# Patient Record
Sex: Female | Born: 1975 | Marital: Married | Smoking: Never smoker
Health system: Southern US, Community
[De-identification: ages and names within clinical notes are randomized; demographics above are authoritative.]

## PROBLEM LIST (undated history)

## (undated) DIAGNOSIS — I4581 Long QT syndrome: Secondary | ICD-10-CM

---

## 2019-11-01 ENCOUNTER — Ambulatory Visit (HOSPITAL_COMMUNITY): Payer: Self-pay | Admitting: Licensed Clinical Social Worker

## 2019-11-15 ENCOUNTER — Ambulatory Visit (INDEPENDENT_AMBULATORY_CARE_PROVIDER_SITE_OTHER): Payer: No Typology Code available for payment source | Admitting: Licensed Clinical Social Worker

## 2019-11-15 DIAGNOSIS — F900 Attention-deficit hyperactivity disorder, predominantly inattentive type: Secondary | ICD-10-CM

## 2019-11-15 DIAGNOSIS — F411 Generalized anxiety disorder: Secondary | ICD-10-CM | POA: Diagnosis not present
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
## 2020-01-12 ENCOUNTER — Other Ambulatory Visit: Payer: Self-pay

## 2020-01-12 ENCOUNTER — Ambulatory Visit (INDEPENDENT_AMBULATORY_CARE_PROVIDER_SITE_OTHER): Payer: No Typology Code available for payment source | Admitting: Clinical

## 2020-01-12 ENCOUNTER — Telehealth (HOSPITAL_COMMUNITY): Payer: Self-pay | Admitting: Clinical

## 2020-01-12 DIAGNOSIS — F900 Attention-deficit hyperactivity disorder, predominantly inattentive type: Secondary | ICD-10-CM | POA: Diagnosis not present

## 2020-01-12 DIAGNOSIS — F411 Generalized anxiety disorder: Secondary | ICD-10-CM

## 2020-01-12 NOTE — Progress Notes (Signed)
Virtual Visit via Video Note  I connected with Rebecca Cherry on 01/12/20 at  9:00 AM EDT by a video enabled telemedicine application and verified that I am speaking with the correct person using two identifiers.  Location: Patient:Home Provider:Office  I discussed the limitations, risks, security and privacy concerns of performing an evaluation and management service by telephone and the availability of in person appointments. I also discussed with the patient that there may be a patient responsible charge related to this service. The patient expressed understanding and agreed to proceed.       THERAPIST PROGRESS NOTE  Session Time:9:00AM-9:45AM  Participation Level:Active  Behavioral Response:CasualAlertIrratable and NA  Type of Therapy:Individual Therapy  Treatment Goals addressed:Coping  Interventions:CBT, Motivational Interviewing, Strength-based and Supportive  Summary:  B. Williamsis a 44 y.o.femalewho presents with ADHD and GAD.The OPT therapist worked with thepatientfor herinitial  OPT treatment session. The OPT therapist utilized Motivational Interviewing to assist in creating therapeutic repore. The patient in the session was engaged and work in collaboration giving feedback about her triggers and symptoms over the past few weeksincludingdifficulty managing her symptoms and noting she feels her current medication is "not working". The OPT therapist worked with the patient implementing elements of CBT. The OPT therapist provided psycho-education during the session.The OPT therapist inquired for holistic care about the patients medication therapy and the patient will be looking to transition her medication therapy from her Primary Care Provider through the [facility] to [facility] health to a psychiatrist.  Suicidal/Homicidal:Nowithout intent/plan  Therapist Response:The OPT therapist worked with the patient for the patientsscheduled  session. The patient was engaged in her session and gave feedback in relation to triggers, symptoms, and behavior responses over the pastfewweeks. The patienthas been having difficulty managing symptoms of her ADHD and Anxiety.The OPT therapist in session worked with the patient and utilized elements of CBT in working with the patient to help empower the patient who at times was emotional. The patient will be looking to transition her medication therapy from her Primary Care Provider through the [place] to [facility] health to a psychiatrist.  Plan: Return again in2/3weeks.  Diagnosis:Axis I:ADHD, inattentive type and GAD  Axis II:No diagnosis  I discussed the assessment and treatment plan with the patient. The patient was provided an opportunity to ask questions and all were answered. The patient agreed with the plan and demonstrated an understanding of the instructions.  The patient was advised to call back or seek an in-person evaluation if the symptoms worsen or if the condition fails to improve as anticipated.  I provided37minutes of non-face-to-face time during this encounter.  Winfred Burn, LCSW 01/12/2020

## 2020-01-12 NOTE — Telephone Encounter (Signed)
Talked with patient in detail about prior auth through [place], must have in order to be seen and if referral date range is not within dates of service patient will be responsible for balance. Patient stated she has not signed anything and is not responsible for for bill. I called [facility] the VA rep Lynne Logan [phone] Auth [id number] only good for Therapy April 21- October 21. Pt asking for Med management [facility] rep advised PATIENT MUST call and get prior Auth that is the patients responsibility    , once approved will be good for 1 year only once year is over patient must request new referral or patient is responsible for balance.

## 2020-02-19 ENCOUNTER — Ambulatory Visit (INDEPENDENT_AMBULATORY_CARE_PROVIDER_SITE_OTHER): Payer: No Typology Code available for payment source | Admitting: Clinical

## 2020-02-19 ENCOUNTER — Other Ambulatory Visit: Payer: Self-pay

## 2020-02-19 DIAGNOSIS — F411 Generalized anxiety disorder: Secondary | ICD-10-CM

## 2020-02-19 DIAGNOSIS — F900 Attention-deficit hyperactivity disorder, predominantly inattentive type: Secondary | ICD-10-CM | POA: Diagnosis not present

## 2020-02-19 NOTE — Progress Notes (Signed)
  Virtual Visit via Video Note  I connected withMeghan Louna Cherry on 02/19/20 at  9:00 AM EDT by a video enabled telemedicine application and verified that I am speaking with the correct person using two identifiers.  Location: Patient:Home Provider:Office  I discussed the limitations, risks, security and privacy concerns of performing an evaluation and management service by telephone and the availability of in person appointments. I also discussed with the patient that there may be a patient responsible charge related to this service. The patient expressed understanding and agreed to proceed.       THERAPIST PROGRESS NOTE  Session Time:9:00AM-9:55AM  Participation Level:Active  Behavioral Response:CasualAlertIrratableand NA  Type of Therapy:Individual Therapy  Treatment Goals addressed:Coping  Interventions:CBT, Motivational Interviewing, Strength-based and Supportive  Summary:Rebecca B. Williamsis a44y.o.femalewho presents with ADHD and GAD.The OPT therapist worked with thepatientfor herOPT treatment session. The OPT therapist utilized Motivational Interviewing to assist in creating therapeutic repore. The patient in the session was engaged and work in Tour manager about hertriggers and symptoms over the past few weeksincludingdifficulty managingher time and work/life balance which has spilled over to conflict in her relationship and with others.. The OPT therapist worked with the patient implementing elements of CBT. The OPT therapist provided psycho-education during the session.The OPT therapist worked with the patient on implementing time management with flexability, positive thinking, and communication.  Suicidal/Homicidal:Nowithout intent/plan  Therapist Response:The OPT therapist worked with the patient for the patientsscheduled session. The patient was engaged in hersession and gave feedback in relation to  triggers, symptoms, and behavior responses over the pastfewweeks. The patienthas been having difficulty managing symptoms of her ADHD and Anxiety.The OPT therapist in session worked with the patient and utilized elements of CBT in working with the patient to help empower the patient who has been triggered by work/life balance difficulty. The OPT therapist worked with the patient on time management with flexibility, positive thinking, and communication with others.  Plan: Return again in2/3weeks.  Diagnosis:Axis I:ADHD, inattentive type and GAD  Axis II:No diagnosis  I discussed the assessment and treatment plan with the patient. The patient was provided an opportunity to ask questions and all were answered. The patient agreed with the plan and demonstrated an understanding of the instructions.  The patient was advised to call back or seek an in-person evaluation if the symptoms worsen or if the condition fails to improve as anticipated.  I provided30minutes of non-face-to-face time during this encounter.  Winfred Burn, LCSW 02/19/2020

## 2020-03-11 ENCOUNTER — Ambulatory Visit (INDEPENDENT_AMBULATORY_CARE_PROVIDER_SITE_OTHER): Payer: No Typology Code available for payment source | Admitting: Clinical

## 2020-03-11 ENCOUNTER — Other Ambulatory Visit: Payer: Self-pay

## 2020-03-11 DIAGNOSIS — F411 Generalized anxiety disorder: Secondary | ICD-10-CM

## 2020-03-11 DIAGNOSIS — F900 Attention-deficit hyperactivity disorder, predominantly inattentive type: Secondary | ICD-10-CM | POA: Diagnosis not present

## 2020-03-11 NOTE — Progress Notes (Signed)
  Virtual Visit via Video Note  I connected withMeghan Deshannon Cherry 09/13/21at 9:00 AM EDTby a video enabled telemedicine application and verified that I am speaking with the correct person using two identifiers.  Location: Patient:Home Provider:Office  I discussed the limitations, risks, security and privacy concerns of performing an evaluation and management service by telephone and the availability of in person appointments. I also discussed with the patient that there may be a patient responsible charge related to this service. The patient expressed understanding and agreed to proceed.       THERAPIST PROGRESS NOTE  Session Time:9:00AM-9:55AM  Participation Level:Active  Behavioral Response:CasualAlertIrratableand NA  Type of Therapy:Individual Therapy  Treatment Goals addressed:Coping  Interventions:CBT, Motivational Interviewing, Strength-based and Supportive  Summary:Rebecca B. Williamsis a44y.o.femalewho presents with ADHDand GAD.The OPT therapist worked with thepatientfor herOPT treatment session. The OPT therapist utilized Motivational Interviewing to assist in creating therapeutic repore. 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 The patient is currently in the process of transitioning her medication management from the [place] to [facility].  Suicidal/Homicidal:Nowithout intent/plan  Therapist Response:The OPT therapist worked with the patient for the patientsscheduled session. The patient was engaged in  hersession and gave feedback in relation to triggers, symptoms, and behavior responses over the pastfewweeks. The patienthas been having difficultymanaging symptoms of her ADHD and Anxiety.The OPT therapist in sessionworkedwith the patient and utilized elements of CBT in working with the patient to help empower the patient who has been triggered by work/life balance difficulty. 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 The OPT therapist worked with the patient on time management with flexibility, positive thinking, and communication with others.  Plan: Return again in2/3weeks.  Diagnosis:Axis I:ADHD,inattentivetype and GAD  Axis II:No diagnosis  I discussed the assessment and treatment plan with the patient. The patient was provided an opportunity to ask questions and all were answered. The patient agreed with the plan and demonstrated an understanding of the instructions.  The patient was advised to call back or seek an in-person evaluation if the symptoms worsen or if the condition fails to improve as anticipated.  I provided5minutes of non-face-to-face time during this encounter.  Winfred Burn, LCSW 03/11/2020

## 2020-04-01 ENCOUNTER — Ambulatory Visit (HOSPITAL_COMMUNITY): Payer: Self-pay | Admitting: Clinical

## 2020-04-02 ENCOUNTER — Telehealth (HOSPITAL_COMMUNITY): Payer: Self-pay | Admitting: Psychiatry

## 2020-04-02 NOTE — Telephone Encounter (Signed)
Returned call to schedule f/u appt, lvm

## 2020-04-08 ENCOUNTER — Ambulatory Visit (HOSPITAL_COMMUNITY): Payer: Self-pay | Admitting: Clinical

## 2020-10-20 ENCOUNTER — Emergency Department (HOSPITAL_COMMUNITY): Admitting: Anesthesiology

## 2020-10-20 ENCOUNTER — Emergency Department (HOSPITAL_COMMUNITY)

## 2020-10-20 ENCOUNTER — Encounter (HOSPITAL_COMMUNITY)
Admission: EM | Disposition: A | Discharge status: Home / Self Care | Payer: Self-pay | Source: Home / Self Care | Attending: Emergency Medicine

## 2020-10-20 ENCOUNTER — Ambulatory Visit (HOSPITAL_COMMUNITY)
Admission: EM | Admit: 2020-10-20 | Discharge: 2020-10-21 | Disposition: A | Discharge status: Home / Self Care | Attending: Orthopedic Surgery | Admitting: Orthopedic Surgery

## 2020-10-20 ENCOUNTER — Encounter (HOSPITAL_COMMUNITY): Payer: Self-pay | Admitting: Emergency Medicine

## 2020-10-20 ENCOUNTER — Other Ambulatory Visit: Payer: Self-pay

## 2020-10-20 DIAGNOSIS — S81812A Laceration without foreign body, left lower leg, initial encounter: Secondary | ICD-10-CM | POA: Insufficient documentation

## 2020-10-20 DIAGNOSIS — S51011A Laceration without foreign body of right elbow, initial encounter: Secondary | ICD-10-CM | POA: Diagnosis not present

## 2020-10-20 DIAGNOSIS — S51811A Laceration without foreign body of right forearm, initial encounter: Secondary | ICD-10-CM | POA: Insufficient documentation

## 2020-10-20 DIAGNOSIS — S71112A Laceration without foreign body, left thigh, initial encounter: Secondary | ICD-10-CM | POA: Diagnosis not present

## 2020-10-20 DIAGNOSIS — S6412XA Injury of median nerve at wrist and hand level of left arm, initial encounter: Secondary | ICD-10-CM | POA: Insufficient documentation

## 2020-10-20 DIAGNOSIS — S61422A Laceration with foreign body of left hand, initial encounter: Secondary | ICD-10-CM | POA: Diagnosis not present

## 2020-10-20 DIAGNOSIS — S61412A Laceration without foreign body of left hand, initial encounter: Secondary | ICD-10-CM | POA: Diagnosis not present

## 2020-10-20 DIAGNOSIS — S41112A Laceration without foreign body of left upper arm, initial encounter: Secondary | ICD-10-CM | POA: Diagnosis not present

## 2020-10-20 DIAGNOSIS — S61411A Laceration without foreign body of right hand, initial encounter: Secondary | ICD-10-CM | POA: Diagnosis not present

## 2020-10-20 DIAGNOSIS — S51812A Laceration without foreign body of left forearm, initial encounter: Secondary | ICD-10-CM | POA: Diagnosis not present

## 2020-10-20 DIAGNOSIS — S0101XA Laceration without foreign body of scalp, initial encounter: Secondary | ICD-10-CM | POA: Diagnosis not present

## 2020-10-20 DIAGNOSIS — S0990XA Unspecified injury of head, initial encounter: Secondary | ICD-10-CM | POA: Diagnosis present

## 2020-10-20 DIAGNOSIS — S41111A Laceration without foreign body of right upper arm, initial encounter: Secondary | ICD-10-CM | POA: Insufficient documentation

## 2020-10-20 DIAGNOSIS — S61210A Laceration without foreign body of right index finger without damage to nail, initial encounter: Secondary | ICD-10-CM | POA: Diagnosis not present

## 2020-10-20 DIAGNOSIS — S66921A Laceration of unspecified muscle, fascia and tendon at wrist and hand level, right hand, initial encounter: Secondary | ICD-10-CM

## 2020-10-20 DIAGNOSIS — S81811A Laceration without foreign body, right lower leg, initial encounter: Secondary | ICD-10-CM | POA: Insufficient documentation

## 2020-10-20 DIAGNOSIS — W540XXA Bitten by dog, initial encounter: Secondary | ICD-10-CM | POA: Insufficient documentation

## 2020-10-20 DIAGNOSIS — Z20822 Contact with and (suspected) exposure to covid-19: Secondary | ICD-10-CM | POA: Insufficient documentation

## 2020-10-20 HISTORY — PX: I & D EXTREMITY: SHX5045

## 2020-10-20 HISTORY — DX: Long QT syndrome: I45.81

## 2020-10-20 LAB — TYPE AND SCREEN
ABO/RH(D): O NEG
Antibody Screen: NEGATIVE

## 2020-10-20 LAB — CBC WITH DIFFERENTIAL/PLATELET
Abs Immature Granulocytes: 0.07 10*3/uL (ref 0.00–0.07)
Basophils Absolute: 0 10*3/uL (ref 0.0–0.1)
Basophils Relative: 0 %
Eosinophils Absolute: 0 10*3/uL (ref 0.0–0.5)
Eosinophils Relative: 0 %
HCT: 33.1 % — ABNORMAL LOW (ref 36.0–46.0)
Hemoglobin: 10.7 g/dL — ABNORMAL LOW (ref 12.0–15.0)
Immature Granulocytes: 0 %
Lymphocytes Relative: 7 %
Lymphs Abs: 1.2 10*3/uL (ref 0.7–4.0)
MCH: 30.6 pg (ref 26.0–34.0)
MCHC: 32.3 g/dL (ref 30.0–36.0)
MCV: 94.6 fL (ref 80.0–100.0)
Monocytes Absolute: 0.7 10*3/uL (ref 0.1–1.0)
Monocytes Relative: 4 %
Neutro Abs: 14.3 10*3/uL — ABNORMAL HIGH (ref 1.7–7.7)
Neutrophils Relative %: 89 %
Platelets: 194 10*3/uL (ref 150–400)
RBC: 3.5 MIL/uL — ABNORMAL LOW (ref 3.87–5.11)
RDW: 13.3 % (ref 11.5–15.5)
WBC: 16.2 10*3/uL — ABNORMAL HIGH (ref 4.0–10.5)
nRBC: 0 % (ref 0.0–0.2)

## 2020-10-20 LAB — BASIC METABOLIC PANEL
Anion gap: 7 (ref 5–15)
BUN: 12 mg/dL (ref 6–20)
CO2: 22 mmol/L (ref 22–32)
Calcium: 8.5 mg/dL — ABNORMAL LOW (ref 8.9–10.3)
Chloride: 110 mmol/L (ref 98–111)
Creatinine, Ser: 0.86 mg/dL (ref 0.44–1.00)
GFR, Estimated: >60 mL/min (ref >60)
Glucose, Bld: 138 mg/dL — ABNORMAL HIGH (ref 70–99)
Potassium: 3.9 mmol/L (ref 3.5–5.1)
Sodium: 139 mmol/L (ref 135–145)

## 2020-10-20 LAB — CBC
HCT: 29.9 % — ABNORMAL LOW (ref 36.0–46.0)
Hemoglobin: 9.8 g/dL — ABNORMAL LOW (ref 12.0–15.0)
MCH: 30.6 pg (ref 26.0–34.0)
MCHC: 32.8 g/dL (ref 30.0–36.0)
MCV: 93.4 fL (ref 80.0–100.0)
Platelets: 169 10*3/uL (ref 150–400)
RBC: 3.2 MIL/uL — ABNORMAL LOW (ref 3.87–5.11)
RDW: 13.2 % (ref 11.5–15.5)
WBC: 14 10*3/uL — ABNORMAL HIGH (ref 4.0–10.5)
nRBC: 0 % (ref 0.0–0.2)

## 2020-10-20 LAB — RESP PANEL BY RT-PCR (FLU A&B, COVID) ARPGX2
Influenza A by PCR: NEGATIVE
Influenza B by PCR: NEGATIVE
SARS Coronavirus 2 by RT PCR: NEGATIVE

## 2020-10-20 LAB — HCG, QUANTITATIVE, PREGNANCY: hCG, Beta Chain, Quant, S: 1 m[IU]/mL (ref <5)

## 2020-10-20 SURGERY — IRRIGATION AND DEBRIDEMENT EXTREMITY
Anesthesia: General | Site: Hand | Laterality: Bilateral

## 2020-10-20 MED ORDER — SODIUM CHLORIDE 0.9 % IV SOLN
3.0000 g | Freq: Once | INTRAVENOUS | Status: AC
  Administered 2020-10-20: 3 g via INTRAVENOUS
  Filled 2020-10-20: qty 3

## 2020-10-20 MED ORDER — LIDOCAINE-EPINEPHRINE (PF) 2 %-1:200000 IJ SOLN
20.0000 mL | Freq: Once | INTRAMUSCULAR | Status: AC
  Administered 2020-10-20: 20 mL
  Filled 2020-10-20: qty 20

## 2020-10-20 MED ORDER — ONDANSETRON HCL 4 MG/2ML IJ SOLN
INTRAMUSCULAR | Status: AC
  Filled 2020-10-20: qty 2

## 2020-10-20 MED ORDER — MORPHINE SULFATE (PF) 4 MG/ML IV SOLN
4.0000 mg | Freq: Once | INTRAVENOUS | Status: AC
  Administered 2020-10-20: 4 mg via INTRAVENOUS
  Filled 2020-10-20: qty 1

## 2020-10-20 MED ORDER — ONDANSETRON HCL 4 MG/2ML IJ SOLN
INTRAMUSCULAR | Status: DC | PRN
  Administered 2020-10-20: 4 mg via INTRAVENOUS

## 2020-10-20 MED ORDER — BUPIVACAINE HCL 0.5 % IJ SOLN
INTRAMUSCULAR | Status: AC
  Filled 2020-10-20: qty 1

## 2020-10-20 MED ORDER — SODIUM CHLORIDE 0.9 % IR SOLN
Status: DC | PRN
  Administered 2020-10-20 – 2020-10-21 (×3): 3000 mL

## 2020-10-20 MED ORDER — LORAZEPAM 2 MG/ML IJ SOLN
1.0000 mg | Freq: Once | INTRAMUSCULAR | Status: AC
  Administered 2020-10-20: 1 mg via INTRAVENOUS
  Filled 2020-10-20: qty 1

## 2020-10-20 MED ORDER — CEFAZOLIN SODIUM 1 G IJ SOLR
INTRAMUSCULAR | Status: AC
  Filled 2020-10-20: qty 20

## 2020-10-20 MED ORDER — PROPOFOL 10 MG/ML IV BOLUS
INTRAVENOUS | Status: AC
  Filled 2020-10-20: qty 20

## 2020-10-20 MED ORDER — FENTANYL CITRATE (PF) 100 MCG/2ML IJ SOLN
INTRAMUSCULAR | Status: DC | PRN
  Administered 2020-10-20: 50 ug via INTRAVENOUS

## 2020-10-20 MED ORDER — SUCCINYLCHOLINE CHLORIDE 20 MG/ML IJ SOLN
INTRAMUSCULAR | Status: DC | PRN
  Administered 2020-10-20: 100 mg via INTRAVENOUS

## 2020-10-20 MED ORDER — BUPIVACAINE HCL 0.5 % IJ SOLN
INTRAMUSCULAR | Status: DC | PRN
  Administered 2020-10-20: 7 mL
  Administered 2020-10-21: 8 mL
  Administered 2020-10-21: 5 mL

## 2020-10-20 MED ORDER — BUPIVACAINE HCL (PF) 0.5 % IJ SOLN
30.0000 mL | Freq: Once | INTRAMUSCULAR | Status: AC
  Administered 2020-10-20: 30 mL
  Filled 2020-10-20: qty 30

## 2020-10-20 MED ORDER — MIDAZOLAM HCL 2 MG/2ML IJ SOLN
INTRAMUSCULAR | Status: AC
  Filled 2020-10-20: qty 2

## 2020-10-20 MED ORDER — KETOROLAC TROMETHAMINE 15 MG/ML IJ SOLN
15.0000 mg | Freq: Once | INTRAMUSCULAR | Status: AC
  Administered 2020-10-20: 15 mg via INTRAVENOUS
  Filled 2020-10-20: qty 1

## 2020-10-20 MED ORDER — ROCURONIUM BROMIDE 100 MG/10ML IV SOLN
INTRAVENOUS | Status: DC | PRN
  Administered 2020-10-20: 60 mg via INTRAVENOUS

## 2020-10-20 MED ORDER — MIDAZOLAM HCL 5 MG/5ML IJ SOLN
INTRAMUSCULAR | Status: DC | PRN
  Administered 2020-10-20: 2 mg via INTRAVENOUS

## 2020-10-20 MED ORDER — PROPOFOL 10 MG/ML IV BOLUS
INTRAVENOUS | Status: DC | PRN
  Administered 2020-10-20: 150 mg via INTRAVENOUS

## 2020-10-20 MED ORDER — EPHEDRINE SULFATE 50 MG/ML IJ SOLN
INTRAMUSCULAR | Status: DC | PRN
  Administered 2020-10-20: 15 mg via INTRAVENOUS

## 2020-10-20 MED ORDER — ACETAMINOPHEN 10 MG/ML IV SOLN
INTRAVENOUS | Status: AC
  Filled 2020-10-20: qty 100

## 2020-10-20 MED ORDER — LACTATED RINGERS IV BOLUS
1000.0000 mL | Freq: Once | INTRAVENOUS | Status: AC
  Administered 2020-10-20: 1000 mL via INTRAVENOUS

## 2020-10-20 MED ORDER — VASOPRESSIN 20 UNIT/ML IV SOLN
INTRAVENOUS | Status: AC
  Filled 2020-10-20: qty 1

## 2020-10-20 MED ORDER — PHENYLEPHRINE HCL (PRESSORS) 10 MG/ML IV SOLN
INTRAVENOUS | Status: DC | PRN
  Administered 2020-10-20: 120 ug via INTRAVENOUS
  Administered 2020-10-20 (×2): 80 ug via INTRAVENOUS
  Administered 2020-10-20: 120 ug via INTRAVENOUS

## 2020-10-20 MED ORDER — FENTANYL CITRATE (PF) 100 MCG/2ML IJ SOLN
100.0000 ug | Freq: Once | INTRAMUSCULAR | Status: AC
  Administered 2020-10-20: 100 ug via INTRAVENOUS
  Filled 2020-10-20: qty 2

## 2020-10-20 MED ORDER — LACTATED RINGERS IV SOLN: INTRAVENOUS | Status: DC | PRN

## 2020-10-20 MED ORDER — DIPHENHYDRAMINE HCL 50 MG/ML IJ SOLN
INTRAMUSCULAR | Status: AC
  Filled 2020-10-20: qty 1

## 2020-10-20 MED ORDER — FENTANYL CITRATE (PF) 250 MCG/5ML IJ SOLN
INTRAMUSCULAR | Status: AC
  Filled 2020-10-20: qty 5

## 2020-10-20 MED ORDER — PHENYLEPHRINE HCL-NACL 10-0.9 MG/250ML-% IV SOLN
INTRAVENOUS | Status: DC | PRN
  Administered 2020-10-20: 50 ug/min via INTRAVENOUS

## 2020-10-20 MED ORDER — LIDOCAINE 2% (20 MG/ML) 5 ML SYRINGE
INTRAMUSCULAR | Status: AC
  Filled 2020-10-20: qty 5

## 2020-10-20 MED ORDER — DEXAMETHASONE SODIUM PHOSPHATE 10 MG/ML IJ SOLN
INTRAMUSCULAR | Status: AC
  Filled 2020-10-20: qty 1

## 2020-10-20 MED ORDER — LIDOCAINE HCL (CARDIAC) PF 100 MG/5ML IV SOSY
PREFILLED_SYRINGE | INTRAVENOUS | Status: DC | PRN
  Administered 2020-10-20: 80 mg via INTRAVENOUS

## 2020-10-20 MED ORDER — CEFAZOLIN SODIUM-DEXTROSE 2-3 GM-%(50ML) IV SOLR
INTRAVENOUS | Status: DC | PRN
  Administered 2020-10-20: 2 g via INTRAVENOUS

## 2020-10-20 MED ORDER — VASOPRESSIN 20 UNIT/ML IV SOLN
INTRAVENOUS | Status: DC | PRN
  Administered 2020-10-20 (×3): 2 [IU] via INTRAVENOUS

## 2020-10-20 MED ORDER — DEXAMETHASONE SODIUM PHOSPHATE 10 MG/ML IJ SOLN
INTRAMUSCULAR | Status: DC | PRN
  Administered 2020-10-20: 10 mg via INTRAVENOUS

## 2020-10-20 SURGICAL SUPPLY — 59 items
BANDAGE ESMARK 6X9 LF (GAUZE/BANDAGES/DRESSINGS) ×1 IMPLANT
BNDG COHESIVE 4X5 TAN STRL (GAUZE/BANDAGES/DRESSINGS) ×3 IMPLANT
BNDG COHESIVE 6X5 TAN STRL LF (GAUZE/BANDAGES/DRESSINGS) ×6 IMPLANT
BNDG ELASTIC 3X5.8 VLCR STR LF (GAUZE/BANDAGES/DRESSINGS) ×6 IMPLANT
BNDG ELASTIC 4X5.8 VLCR STR LF (GAUZE/BANDAGES/DRESSINGS) ×3 IMPLANT
BNDG ELASTIC 6X5.8 VLCR STR LF (GAUZE/BANDAGES/DRESSINGS) ×3 IMPLANT
BNDG ESMARK 6X9 LF (GAUZE/BANDAGES/DRESSINGS) ×3
BNDG GAUZE ELAST 4 BULKY (GAUZE/BANDAGES/DRESSINGS) ×6 IMPLANT
BOOTCOVER CLEANROOM LRG (PROTECTIVE WEAR) ×3 IMPLANT
CORD BIPOLAR FORCEPS 12FT (ELECTRODE) ×6 IMPLANT
COVER SURGICAL LIGHT HANDLE (MISCELLANEOUS) ×6 IMPLANT
COVER WAND RF STERILE (DRAPES) IMPLANT
CUFF TOURN SGL QUICK 34 (TOURNIQUET CUFF)
CUFF TRNQT CYL 34X4.125X (TOURNIQUET CUFF) IMPLANT
DECANTER SPIKE VIAL GLASS SM (MISCELLANEOUS) ×3 IMPLANT
DRAPE ORTHO SPLIT 77X108 STRL (DRAPES) ×4
DRAPE SURG ORHT 6 SPLT 77X108 (DRAPES) ×2 IMPLANT
DRESSING MEPILEX FLEX 4X4 (GAUZE/BANDAGES/DRESSINGS) ×4 IMPLANT
DRSG MEPILEX FLEX 4X4 (GAUZE/BANDAGES/DRESSINGS) ×12
DURAPREP 26ML APPLICATOR (WOUND CARE) IMPLANT
ELECT CAUTERY BLADE 6.4 (BLADE) ×3 IMPLANT
ELECT REM PT RETURN 9FT ADLT (ELECTROSURGICAL) ×3
ELECTRODE REM PT RTRN 9FT ADLT (ELECTROSURGICAL) ×1 IMPLANT
EVACUATOR 1/8 PVC DRAIN (DRAIN) IMPLANT
GAUZE 4X4 16PLY ~~LOC~~+RFID DBL (SPONGE) ×9 IMPLANT
GAUZE SPONGE 4X4 12PLY STRL (GAUZE/BANDAGES/DRESSINGS) ×3 IMPLANT
GAUZE SPONGE 4X4 12PLY STRL LF (GAUZE/BANDAGES/DRESSINGS) ×3 IMPLANT
GAUZE XEROFORM 1X8 LF (GAUZE/BANDAGES/DRESSINGS) IMPLANT
GAUZE XEROFORM 5X9 LF (GAUZE/BANDAGES/DRESSINGS) ×6 IMPLANT
GLOVE BIO SURGEON STRL SZ7 (GLOVE) ×3 IMPLANT
GLOVE ORTHO TXT STRL SZ7.5 (GLOVE) ×9 IMPLANT
GLOVE SURG PR MICRO ENCORE 7 (GLOVE) ×12 IMPLANT
GLOVE SURG UNDER POLY LF SZ7 (GLOVE) ×3 IMPLANT
GOWN STRL REUS W/ TWL LRG LVL3 (GOWN DISPOSABLE) ×5 IMPLANT
GOWN STRL REUS W/TWL LRG LVL3 (GOWN DISPOSABLE) ×10
HANDPIECE INTERPULSE COAX TIP (DISPOSABLE)
KIT BASIN OR (CUSTOM PROCEDURE TRAY) ×3 IMPLANT
KIT TURNOVER KIT B (KITS) ×3 IMPLANT
MANIFOLD NEPTUNE II (INSTRUMENTS) ×3 IMPLANT
NS IRRIG 1000ML POUR BTL (IV SOLUTION) ×3 IMPLANT
PACK ORTHO EXTREMITY (CUSTOM PROCEDURE TRAY) ×3 IMPLANT
PAD ARMBOARD 7.5X6 YLW CONV (MISCELLANEOUS) ×6 IMPLANT
PENCIL BUTTON HOLSTER BLD 10FT (ELECTRODE) ×3 IMPLANT
SET HNDPC FAN SPRY TIP SCT (DISPOSABLE) IMPLANT
SET IRRIG Y TYPE TUR BLADDER L (SET/KITS/TRAYS/PACK) ×6 IMPLANT
SOL PREP PROV IODINE SCRUB 4OZ (MISCELLANEOUS) ×9 IMPLANT
SPONGE LAP 18X18 RF (DISPOSABLE) ×3 IMPLANT
STOCKINETTE IMPERVIOUS 9X36 MD (GAUZE/BANDAGES/DRESSINGS) ×3 IMPLANT
STOCKINETTE IMPERVIOUS LG (DRAPES) ×3 IMPLANT
SUT ETHILON 3 0 PS 1 (SUTURE) ×3 IMPLANT
SUT ETHILON 4 0 PS 2 18 (SUTURE) ×15 IMPLANT
SWAB CULTURE ESWAB REG 1ML (MISCELLANEOUS) IMPLANT
TOWEL GREEN STERILE (TOWEL DISPOSABLE) ×3 IMPLANT
TOWEL GREEN STERILE FF (TOWEL DISPOSABLE) ×3 IMPLANT
TUBE CONNECTING 12'X1/4 (SUCTIONS) ×4
TUBE CONNECTING 12X1/4 (SUCTIONS) ×8 IMPLANT
UNDERPAD 30X36 HEAVY ABSORB (UNDERPADS AND DIAPERS) ×3 IMPLANT
WATER STERILE IRR 1000ML POUR (IV SOLUTION) ×3 IMPLANT
YANKAUER SUCT BULB TIP NO VENT (SUCTIONS) ×6 IMPLANT

## 2020-10-20 NOTE — ED Triage Notes (Signed)
Pt BIB [facility], pt attacked by her dog, injuries to the back of her head, back of her shoulders, bilateral arms and bilateral hands. GCS 15, EMS BP 142/78, HR 73, SpO2 100% room air. Pt given total 32mg  ketamine, last dose at 1519, and fentanyl, 1L NS.

## 2020-10-20 NOTE — ED Notes (Signed)
Patient to OR, care handoff given to CRNA

## 2020-10-20 NOTE — Anesthesia Preprocedure Evaluation (Signed)
Anesthesia Evaluation  Patient identified by MRN, date of birth, ID band Patient awake  General Assessment Comment:S/p dog bite  Reviewed: Allergy & Precautions, NPO status , Patient's Chart, lab work & pertinent test results  Airway Mallampati: II  TM Distance: >3 FB Neck ROM: Full    Dental  (+) Teeth Intact, Dental Advisory Given   Pulmonary neg pulmonary ROS,    Pulmonary exam normal breath sounds clear to auscultation       Cardiovascular Normal cardiovascular exam Rhythm:Regular Rate:Normal  Prolonged QT by EMS report   Neuro/Psych negative neurological ROS  negative psych ROS   GI/Hepatic negative GI ROS, Neg liver ROS,   Endo/Other  negative endocrine ROS  Renal/GU negative Renal ROS     Musculoskeletal negative musculoskeletal ROS (+)   Abdominal   Peds  (+) ADHD Hematology  (+) Blood dyscrasia, anemia ,   Anesthesia Other Findings Day of surgery medications reviewed with the patient.  Reproductive/Obstetrics negative OB ROS                             Anesthesia Physical Anesthesia Plan  ASA: II and emergent  Anesthesia Plan: General   Post-op Pain Management:    Induction: Intravenous, Rapid sequence and Cricoid pressure planned  PONV Risk Score and Plan: 3 and Midazolam, Diphenhydramine, Propofol infusion and Dexamethasone  Airway Management Planned: Oral ETT  Additional Equipment:   Intra-op Plan:   Post-operative Plan: Extubation in OR  Informed Consent: I have reviewed the patients History and Physical, chart, labs and discussed the procedure including the risks, benefits and alternatives for the proposed anesthesia with the patient or authorized representative who has indicated his/her understanding and acceptance.     Dental advisory given  Plan Discussed with: CRNA  Anesthesia Plan Comments:         Anesthesia Quick Evaluation

## 2020-10-20 NOTE — Anesthesia Procedure Notes (Signed)
Procedure Name: Intubation Date/Time: 10/20/2020 10:27 PM Performed by: Mariyana Fulop T, CRNA Pre-anesthesia Checklist: Patient identified, Emergency Drugs available, Suction available and Patient being monitored Patient Re-evaluated:Patient Re-evaluated prior to induction Oxygen Delivery Method: Circle system utilized Preoxygenation: Pre-oxygenation with 100% oxygen Induction Type: IV induction Ventilation: Mask ventilation without difficulty Laryngoscope Size: Miller and 2 Grade View: Grade I Tube type: Oral Tube size: 7.5 mm Number of attempts: 1 Airway Equipment and Method: Stylet and Oral airway Placement Confirmation: ETT inserted through vocal cords under direct vision,  positive ETCO2 and breath sounds checked- equal and bilateral Secured at: 21 cm Tube secured with: Tape Dental Injury: Teeth and Oropharynx as per pre-operative assessment

## 2020-10-20 NOTE — Op Note (Addendum)
10/20/2020  11:33 PM  PATIENT:  Rebecca Cherry    PRE-OPERATIVE DIAGNOSIS: Dog bites to the bilateral upper extremities and left lower extremity  POST-OPERATIVE DIAGNOSIS:    1.  Dog bite to bilateral upper extremity and left lower extremity 2.  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  Multiple lacerations of the left upper extremity as outlined below 4.  Multiple lacerations to the right upper extremity as outlined below with disruption of right extensor Teck tendon mechanisms to the long finger 5.  Lacerations to the left upper thigh  PROCEDURE:    1.  Left upper extremity excisional debridement of multiple lacerations of the upper arm, forearm, and hand with complex closure of lacerations. 2.  Exploration of left palmar wound, hand, with removal of deep foreign body, multiple blades of grass 3.  excisional debridement, irrigation, and complex closure:  1.  Left posterior upper arm, 1.5 cm x 0.5 cm 2.  Left upper arm 1.0 x 0.5 cm 3.  Left forearm, 9 mm x 2 mm 4.  Left dorsal hand wound, 12 x 3 mm 5.  Left dorsal hand wound 10 mm x 1 mm 6.  Left dorsal wound 11 mm x 1 mm 7.  Left palm wound, 34 mm x 5 mm 8.  Left palm wound 11 x 5 mm 9.  Left palm wound 20 mm x 1 mm 10.  Left palm wound 3 mm x 1 mm 11.  Right upper arm wound 20 x 9 mm 12.  Right upper arm wound 14 x 5 mm 13.  Right upper arm wound 10 x 2 mm 14.  Right posterior elbow wound 25 x 40 mm 15.  Right forearm wound 10 x 5 mm 16.  Right dorsal hand wound 9 x 3 mm 17.  Right dorsal hand wound 11 x 2 mm 18.  Right dorsal hand wound 22 x 8 mm 19.  Right dorsal index finger wound 20 x 2 mm 20. Left proximal thigh wound 7x92mm 21.  Left proximal thigh wound 22 x 10 mm    SURGEON:  Eulas Post, MD  PHYSICIAN ASSISTANT: Janine Ores, PA-C,  present and scrubbed throughout the case, critical for completion in a timely fashion, and for retraction, instrumentation, and closure.  ANESTHESIA:   General  PREOPERATIVE INDICATIONS:  Rebecca Cherry is a  45 y.o. female who was attacked by her dog and had multiple dog bites on both upper extremities and her left lower extremity.  On her preoperative examination she had what was thought to be a tendon that was extruded from the palm, and her neurologic exam did have some paresthesias although she reported having sensation intact in her fingertips.  I suspect this was optimistic thinking, and although she did close her eyes during the exam, the degree of neurologic injury found intraoperatively would reflect that she had substantial damage from the dog bite.  The risks benefits and alternatives were discussed with the patient preoperatively including but not limited to the risks of infection, bleeding, nerve injury, the need for revision surgery, cardiopulmonary complications, the need for revision surgery, among others, and the patient was willing to proceed.  ESTIMATED BLOOD LOSS: Minimal  OPERATIVE IMPLANTS: None  OPERATIVE FINDINGS: The left upper arm puncture wounds appeared to involve primarily just  skin and subcutaneous tissue, although it did get down to the muscular fascia.  The forearm wounds were similar, and the dorsal hand wounds did involve some of the musculature as well, although I did not identify any traumatically injured structures on that location.  The most dramatic finding was a transected median nerve that was basically the caliber of the median nerve as it exits the carpal tunnel distally, with no present branches from what I could see.  Additionally, there was a long segment of transected vein that had thrombosed, as well as a long segment of digital nerve that measured at least 7 to 10 cm coming out of the palmar wound.  I closely evaluated her capillary refill as  well as the coloration of her fingertips, and these appear to be intact, with less than 3-second capillary refill in all of the fingers, although certainly the long finger and the ring finger was a little more sluggish than the others, but nonetheless did appear to have adequate flow.  Debridement type: Excisional Debridement  Side: bilaterally  Body Location: Upper extremities and left upper thigh   Tools used for debridement: scissors  Pre-debridement Wound size (cm):  As above.  Post-debridement Wound size (cm):  As above.  Debridement depth beyond dead/damaged tissue down to healthy viable tissue: yes  Tissue layer involved: skin, subcutaneous tissue, muscle / fascia  Nature of tissue removed: Necrotic and Devitalized Tissue  Irrigation volume: 6 liters on the left upper extremity.  6 liters on the right upper extremity.  1 liter on the left upper thigh.     Irrigation fluid type: Normal Saline   OPERATIVE PROCEDURE: The patient was brought to the operating room and placed in the supine position.  General anesthesia was administered.  We started with her left upper extremity.  A Pree scrub was performed with chlorhexidine, and then a thorough Betadine scrub and paint was performed.  I started at the proximal arm, posteriorly, and irrigated copiously, used a scissors to remove a small amount of necrotic fat, cleaned the borders of the skin, and repaired these with nylon.  I then moved distally, and performed the same type of procedure to the wounds on the forearm, as well as the back of the hand.  I explored the volar aspect of the hand, and found a fairly large, 4 cm piece of graft that was embedded deep within the palm of the hand.  During exploration I found that multiple other smaller contaminated foreign material from the debris, and also encountered the stump of the median nerve.  I used a scissors to excise a small part of the vein that was no longer viable, and I also excised a  area of the proximal digital nerve which was necrotic, and completely hemorrhagic, contused, and destroyed.  This measured about 6 mm.  I irrigated copiously with normal saline throughout the remainder of the wounds on the palmar and dorsal aspects of the hand, and then repaired all of her wounds loosely with nylon suture.  She was injected for pain control, and sterile gauze applied.  She will need fairly substantial reconstructive nerve surgery on her median nerve once we are sure that she is clear of infection, particular given the gross contamination found at the time of tonight's surgery.  Entered my attention to the right upper extremity.  Under a separate sterile prep and drape the right upper extremity was prescrubbed and then scrubbed with Betadine scrub and paint.  All of the wounds  were explored, and any necrotic or damaged tissue was excised with a skin scissors and a pickup.  Most of the wounds simply went to the skin and subcutaneous tissue although the right dorsal hand wound to the long finger overlying the metacarpal phalangeal joint did disrupt the extensor mechanism.  All of these wounds were copiously irrigated, and then repaired with nylon suture.  She will need definitive surgical management of her tendon disruption once her soft tissue management has been optimized, and infection risk has been minimized.  Left lower extremity was then draped and cleaned and any abnormal tissue removed along with grass and gross contamination, and then repaired with nylon suture.  All the wounds were dressed, she was awakened and returned the PACU in stable and satisfactory condition.  She will plan to be discharged on oral antibiotics, and we will help coordinate hand surgery subspecialty referral particular given the right long finger extensor tendon and the left palm median nerve disruption.

## 2020-10-20 NOTE — Consult Note (Addendum)
   ORTHOPAEDIC CONSULTATION  REQUESTING PHYSICIAN: Charlynne Pander, MD  Chief Complaint: Multiple dog bites to bilateral upper extremities  HPI: Rebecca Cherry is a 45 y.o. female with history of long QT syndrome, left carpal tunnel syndrome, recent right carpal tunnel release who complains of multiple dog bites of bilateral upper extremities. She was attacked by her dog this afternoon and sustained multiple lacerations to scalp, bilateral upper extremities, left thigh. Pain was severe on arrival to ER but improved significantly with IV pain medication. Patient noticed that she could not extend right middle finger shortly after the attack. Denies numbness, states hands feel diffusely "tingley".  Past Medical History:  Diagnosis Date  . Prolonged QT interval syndrome    History reviewed. No pertinent surgical history. Social History   Socioeconomic History  . Marital status: Married    Spouse name: Not on file  . Number of children: Not on file  . Years of education: Not on file  . Highest education level: Not on file  Occupational History  . Not on file  Tobacco Use  . Smoking status: Not on file  . Smokeless tobacco: Not on file  Substance and Sexual Activity  . Alcohol use: Not on file  . Drug use: Not on file  . Sexual activity: Not on file  Other Topics Concern  . Not on file  Social History Narrative  . Not on file   Social Determinants of Health   Financial Resource Strain: Not on file  Food Insecurity: Not on file  Transportation Needs: Not on file  Physical Activity: Not on file  Stress: Not on file  Social Connections: Not on file   History reviewed. No pertinent family history. No Known Allergies   Positive ROS: All other systems have been reviewed and were otherwise negative with the exception of those mentioned in the HPI and as above.  Physical Exam: General: Alert, laying in bed, in no acute distress as ED provider sutures her scalp  lacerations. Cardiovascular: No pedal edema Respiratory: No cyanosis, no use of accessory musculature GI: No organomegaly, abdomen is soft and non-tender Skin: Please see photos below. Neurologic: Noted below. Psychiatric: Patient is competent for consent with normal mood and affect Lymphatic: No axillary or cervical lymphadenopathy  MUSCULOSKELETAL: Lacerations, edema, and ecchymosis as characterized in photos below. Able to flex, extend, and abduct all fingers of bilateral hands, except right middle finger. Unable to extend left middle finger, able to flex, pain with passive extension. Patient endorses distal sensation to all fingers. Radial pulses intact bilaterally.                                  Assessment/Plan: Multiple dog bites to bilateral upper extremities with contamination with left middle finger extensor tendon injury -plan for urgent irrigation and debridement of upper extremity lacerations with closures as appropriate and repair of left long extensor tendon, risks benefits and alteratives of this operation were discussed with patient and she agrees to move forward with surgical treatment - will plan to discharge on oral antibiotics with close follow up in our office  Armida Sans, PA-C  10/20/2020 9:39 PM

## 2020-10-20 NOTE — ED Provider Notes (Signed)
[facility] Note   CSN: [id number] Arrival date & time: 10/20/20  1535     History Chief Complaint  Patient presents with  . Animal Bite    Rebecca Cherry is a 45 y.o. female.  HPI      Rebecca Cherry is a 45 y.o. female, with a history of prolonged QT syndrome, presenting to the ED with injuries from a dog that occurred shortly prior to arrival.  Multiple dog bites and lacerations including scalp, both upper extremities, left lower extremity. Tetanus vaccination up-to-date.  Dog is owned by the patient and is up-to-date on rabies vaccination. Denies anticoagulation. Denies syncope, neck/back pain, chest injury, abdominal injury, numbness, or any other complaints.   Past Medical History:  Diagnosis Date  . Prolonged QT interval syndrome     There are no problems to display for this patient.   History reviewed. No pertinent surgical history.   OB History   No obstetric history on file.     History reviewed. No pertinent family history.     Home Medications Prior to Admission medications   Not on File    Allergies    Patient has no known allergies.  Review of Systems   Review of Systems  Respiratory: Negative for shortness of breath.   Cardiovascular: Negative for chest pain.  Gastrointestinal: Negative for abdominal pain, nausea and vomiting.  Musculoskeletal: Positive for arthralgias.  Skin: Positive for wound.  Neurological: Negative for syncope and numbness.  All other systems reviewed and are negative.   Physical Exam Updated Vital Signs BP (!) 101/41 (BP Location: Right Leg)   Pulse 67   Temp 97.6 F (36.4 C) (Oral)   Resp 20   LMP 10/17/2020   SpO2 100%   Physical Exam Vitals and nursing note reviewed.  Constitutional:      General: She is not in acute distress.    Appearance: She is well-developed. She is not diaphoretic.  HENT:     Head: Normocephalic.      Comments: Approximately 7 cm laceration to the left parietal scalp, as shown in the photos.  Hematoma and subcutaneous clot noted.     Nose:     Comments: Some dried blood noted to the nares, however, no tenderness, swelling, or pain to the nose.  No intranasal injuries noted.  No septal hematoma.  This blood may be due to the patient's scalp injury running onto her face.    Mouth/Throat:     Mouth: Mucous membranes are moist.     Pharynx: Oropharynx is clear.  Eyes:     Extraocular Movements: Extraocular movements intact.     Conjunctiva/sclera: Conjunctivae normal.     Pupils: Pupils are equal, round, and reactive to light.  Cardiovascular:     Rate and Rhythm: Normal rate and regular rhythm.     Pulses: Normal pulses.          Radial pulses are 2+ on the right side and 2+ on the left side.       Posterior tibial pulses are 2+ on the right side and 2+ on the left side.     Heart sounds: Normal heart sounds.     Comments: Tactile temperature in the extremities appropriate and equal bilaterally. Pulmonary:     Effort: Pulmonary effort is normal. No respiratory distress.     Breath sounds: Normal breath sounds.  Abdominal:     Palpations: Abdomen is soft.     Tenderness:  There is no abdominal tenderness. There is no guarding.  Musculoskeletal:     Cervical back: Normal range of motion and neck supple. No tenderness.     Right lower leg: No edema.     Left lower leg: No edema.     Comments: Patient has several scattered lacerations to the hands, upper extremities, and left leg.  There is a laceration on the dorsal hand proximal to the middle finger.  She is unable to extend the middle finger.  Suspect tendon injury in this location.  Laceration to the left palm with tendon versus vessel extending from the wound.  Distal circulation seems to be intact.  She seems to have full flexion and extension intact in the fingers of the left hand.  Full range of motion without noted pain or  difficulty in the wrists, elbows, shoulders, hips, knees, and ankles.  She does not have any lacerations or noted injuries to the neck, chest, abdomen, pelvis, or back.  Overall trauma exam performed without any abnormalities noted other than those mentioned.  Skin:    General: Skin is warm and dry.     Capillary Refill: Capillary refill takes less than 2 seconds.  Neurological:     Mental Status: She is alert and oriented to person, place, and time.     Comments: Sensation to light touch grossly intact in the extremities down through the fingers and toes. Strength 5/5 in the major joints of the upper and lower extremities bilaterally.  Deficits in the hands are noted separately. No noted cognitive deficit. Handles oral secretions without noted difficulty.   Psychiatric:        Mood and Affect: Mood and affect normal.        Speech: Speech normal.        Behavior: Behavior normal.                                           ED Results / Procedures / Treatments   Labs (all labs ordered are listed, but only abnormal results are displayed) Labs Reviewed  BASIC METABOLIC PANEL - Abnormal; Notable for the following components:      Result Value   Glucose, Bld 138 (*)    Calcium 8.5 (*)    All other components within normal limits  CBC WITH DIFFERENTIAL/PLATELET - Abnormal; Notable for the following components:   WBC 16.2 (*)    RBC 3.50 (*)    Hemoglobin 10.7 (*)    HCT 33.1 (*)    Neutro Abs 14.3 (*)    All other components within normal limits  CBC - Abnormal; Notable for the following components:   WBC 14.0 (*)    RBC 3.20 (*)    Hemoglobin 9.8 (*)    HCT 29.9 (*)    All other components within normal limits  RESP PANEL BY RT-PCR (FLU A&B, COVID) [id number]  HCG, QUANTITATIVE, PREGNANCY  TYPE AND SCREEN  ABO/RH    EKG EKG Interpretation  Date/Time:  Sunday October 20 2020 16:16:42 EDT Ventricular Rate:  59 PR Interval:    QRS  Duration: 105 QT Interval:  512 QTC Calculation: 508 R Axis:   81 Text Interpretation: Atrial fibrillation Borderline prolonged QT interval No previous ECGs available Confirmed by Richardean Canal ([id number]) on 10/20/2020 4:21:53 PM   Radiology DG Forearm Left  Result Date: 10/20/2020 CLINICAL DATA:  Dog  bite with injuries to head, shoulders, bilateral arms and bilateral hands. Swelling. EXAM: LEFT FOREARM - 2 VIEW COMPARISON:  None. FINDINGS: Cortical margins of the forearm are intact. There is no evidence of fracture or other focal bone lesions. Wrist and elbow alignment are maintained. Scattered soft tissue edema. No radiopaque foreign body. IMPRESSION: Scattered soft tissue edema. No fracture. Electronically Signed   By: Narda Rutherford M.D.   On: 10/20/2020 17:07   DG Forearm Right  Result Date: 10/20/2020 CLINICAL DATA:  Dog bite with injuries to head, shoulders, bilateral arms and bilateral hands. Swelling. EXAM: RIGHT FOREARM - 2 VIEW COMPARISON:  None. FINDINGS: Cortical margins of the radius and ulna are intact. There is no evidence of fracture or other focal bone lesions. Wrist and elbow alignment are maintained. Small soft tissue density adjacent to the distal ulna appears to represent calcification rather than foreign body. Soft tissue edema is noted about the dorsal mid proximal forearm. IMPRESSION: 1. Soft tissue edema without acute fracture. 2. Small density adjacent to the distal ulna appears to represent calcification rather than foreign body. Electronically Signed   By: Narda Rutherford M.D.   On: 10/20/2020 17:22   CT Head Wo Contrast  Result Date: 10/20/2020 CLINICAL DATA:  Dog attack with multiple injuries including head laceration EXAM: CT HEAD WITHOUT CONTRAST TECHNIQUE: Contiguous axial images were obtained from the base of the skull through the vertex without intravenous contrast. COMPARISON:  None. FINDINGS: Brain: No evidence of acute infarction, hemorrhage, hydrocephalus,  extra-axial collection or mass lesion/mass effect. Vascular: No hyperdense vessel. Scattered atherosclerotic calcifications of the internal carotid arteries at skull base. Skull: Normal. Negative for fracture or focal lesion. Sinuses/Orbits: Paranasal sinuses and mastoid air cells are predominantly clear. Orbits are intact. Other: Left lateral extra calvarial hematoma with cutaneous skin defects, subcutaneous emphysema and edema. IMPRESSION: 1. No acute intracranial abnormality. 2. Left lateral extra calvarial hematoma with cutaneous skin defects, subcutaneous emphysema and edema. No underlying calvarial fracture. Electronically Signed   By: Maudry Mayhew MD   On: 10/20/2020 17:10   DG Humerus Left  Result Date: 10/20/2020 CLINICAL DATA:  Dog bite with injuries to head, shoulders, bilateral arms and bilateral hands. Swelling. EXAM: LEFT HUMERUS - 2+ VIEW COMPARISON:  None. FINDINGS: Cortical margins of the humerus are intact. There is no evidence of fracture or other focal bone lesions. Shoulder and elbow alignment are maintained. Scattered soft tissue air involving the distal arm, no radiopaque foreign body. IMPRESSION: Scattered soft tissue air. No fracture. Electronically Signed   By: Narda Rutherford M.D.   On: 10/20/2020 17:07   DG Humerus Right  Result Date: 10/20/2020 CLINICAL DATA:  Dog bite with injuries to head, shoulders, bilateral arms and bilateral hands. Swelling. EXAM: RIGHT HUMERUS - 2+ VIEW COMPARISON:  None. FINDINGS: Cortical margins of the humerus are intact. There is no evidence of fracture or other focal bone lesions. Shoulder and elbow alignment are maintained. Soft tissue edema about the mid lateral aspect of the humerus with soft tissue air. No radiopaque foreign body. IMPRESSION: Soft tissue edema and soft tissue air. No fracture. Electronically Signed   By: Narda Rutherford M.D.   On: 10/20/2020 17:06   DG Hand Complete Left  Result Date: 10/20/2020 CLINICAL DATA:  Dog bite  with injuries to head, shoulders, bilateral arms and bilateral hands. Swelling. EXAM: LEFT HAND - COMPLETE 3+ VIEW COMPARISON:  None. FINDINGS: Difficulty with positioning due to pain. Digits are held in flexion on all views and  not well assessed. Allowing for this, no evidence of fracture. No frank dislocation. Soft tissue edema greatest overlying the thumb metacarpal with soft tissue air. No radiopaque foreign body. IMPRESSION: Soft tissue edema greatest overlying the thumb metacarpal with soft tissue air. No fracture. No radiopaque foreign body. Electronically Signed   By: Narda Rutherford M.D.   On: 10/20/2020 17:05   DG Hand Complete Right  Result Date: 10/20/2020 CLINICAL DATA:  Dog bite with injuries to head, shoulders, bilateral arms and bilateral hands. Swelling. EXAM: RIGHT HAND - COMPLETE 3+ VIEW COMPARISON:  None. FINDINGS: Non traditional positioning due to pain which limits assessment. No evidence of fracture. No gross dislocation allowing for positioning. Soft tissue edema overlies the dorsum of the hand with occasional soft tissue air. No radiopaque foreign body. IMPRESSION: Soft tissue edema and occasional soft tissue air. No radiopaque foreign body or fracture. Electronically Signed   By: Narda Rutherford M.D.   On: 10/20/2020 17:03    Procedures .Marland KitchenLaceration Repair  Date/Time: 10/20/2020 8:30 PM Performed by: Anselm Pancoast, PA-C Authorized by: Anselm Pancoast, PA-C   Consent:    Consent obtained:  Verbal   Consent given by:  Patient   Risks, benefits, and alternatives were discussed: yes     Risks discussed:  Infection, need for additional repair, poor cosmetic result, poor wound healing, pain and retained foreign body Universal protocol:    Procedure explained and questions answered to patient or proxy's satisfaction: yes     Patient identity confirmed:  Verbally with patient and provided demographic data Anesthesia:    Anesthesia method:  Local infiltration   Local anesthetic:   Lidocaine 2% WITH epi and bupivacaine 0.5% w/o epi Laceration details:    Location:  Scalp   Scalp location:  L parietal   Length (cm):  7 Pre-procedure details:    Preparation:  Patient was prepped and draped in usual sterile fashion and imaging obtained to evaluate for foreign bodies Exploration:    Imaging outcome: foreign body not noted     Wound exploration: wound explored through full range of motion   Treatment:    Area cleansed with:  Saline   Amount of cleaning:  Extensive   Irrigation solution:  Sterile saline   Irrigation volume:  1000cc   Irrigation method:  Syringe   Debridement:  Minimal Skin repair:    Repair method:  Sutures   Suture size:  3-0   Wound skin closure material used: Vicryl.   Suture technique:  Horizontal mattress (and one simple interrupted)   Number of sutures:  6 Approximation:    Approximation:  Loose Repair type:    Repair type:  Complex Post-procedure details:    Dressing:  Open (no dressing)   Procedure completion:  Tolerated well, no immediate complications     Medications Ordered in ED Medications  sodium chloride irrigation 0.9 % (3,000 mLs  Given 10/21/20 0003)  bupivacaine (MARCAINE) 0.5 % (with pres) injection (7 mLs Infiltration Given 10/20/20 2337)  lactated ringers bolus 1,000 mL (0 mLs Intravenous Stopped 10/20/20 1801)  fentaNYL (SUBLIMAZE) injection 100 mcg (100 mcg Intravenous Given 10/20/20 1612)  Ampicillin-Sulbactam (UNASYN) 3 g in sodium chloride 0.9 % 100 mL IVPB (0 g Intravenous Stopped 10/20/20 1800)  LORazepam (ATIVAN) injection 1 mg (1 mg Intravenous Given 10/20/20 1716)  ketorolac (TORADOL) 15 MG/ML injection 15 mg (15 mg Intravenous Given 10/20/20 1732)  lactated ringers bolus 1,000 mL (0 mLs Intravenous Stopped 10/20/20 1950)  lidocaine-EPINEPHrine (XYLOCAINE W/EPI) 2 %-  1:200000 (PF) injection 20 mL (20 mLs Infiltration Given 10/20/20 1733)  bupivacaine (MARCAINE) 0.5 % injection 30 mL (30 mLs Infiltration Given 10/20/20  1733)  morphine 4 MG/ML injection 4 mg (4 mg Intravenous Given 10/20/20 1900)  lactated ringers bolus 1,000 mL (0 mLs Intravenous Stopped 10/20/20 2142)    ED Course  I have reviewed the triage vital signs and the nursing notes.  Pertinent labs & imaging results that were available during my care of the patient were reviewed by me and considered in my medical decision making (see chart for details).  Clinical Course as of 10/21/20 4128  Sun Oct 20, 2020  2020 Spoke with Dr. Dion Saucier, on-call for hand surgery. We discussed the patient's injuries, mechanism of injury, deficits noted on exam.  He reviewed the clinical pictures and imaging.  He will plan to take the patient to the OR tonight for washout. [SJ]    Clinical Course User Index [SJ] Chade Pitner, Hillard Danker, PA-C   MDM Rules/Calculators/A&P                          Patient presents for evaluation of injuries from dog bites. Patient has several areas of dog bite.  Concern for some extensive injuries, especially to her hands, specifically concern for tendon injuries.  The status of the swelling in her hands was reevaluated frequently.  Tissues remain soft without evidence of compartment syndrome.  It is important to note that the patient had instances of documented hypotension without concurrent tachycardia.  She states she has known issues with vagal episodes when she is under stress or in pain.  These occurred frequently following her surgeries and after injuries.  She had no changes in mental status or overall presentation.  I do not think these episodes are due to blood loss, as evidenced by the minimal decrease of her hemoglobin during her ED course.  Therefore, I do not think this patient qualifies as a level 1 trauma activation.  Patient has several wounds that will need to heal by secondary intention due to dog bite as their origin.   Findings and plan of care discussed with attending physician, Chaney Malling, MD. Dr. Silverio Lay personally evaluated  and examined this patient.  Vitals:   10/20/20 2045 10/20/20 2053 10/20/20 2100 10/20/20 2115  BP: (!) 113/46  (!) 100/41 (!) 112/50  Pulse: 75  72 80  Resp: 20  14 (!) 28  Temp:      TempSrc:      SpO2: 100%  100% 100%  Weight:  79.4 kg    Height:  5\' 8"  (1.727 m)       Final Clinical Impression(s) / ED Diagnoses Final diagnoses:  Dog bite, initial encounter  Laceration of left hand involving tendon, initial encounter  Scalp laceration, initial encounter    Rx / DC Orders ED Discharge Orders    None       10/21/20 0038    10/23/20, MD 10/22/20 2236

## 2020-10-21 ENCOUNTER — Other Ambulatory Visit (HOSPITAL_COMMUNITY): Payer: Self-pay

## 2020-10-21 ENCOUNTER — Encounter (HOSPITAL_COMMUNITY): Payer: Self-pay | Admitting: Orthopedic Surgery

## 2020-10-21 MED ORDER — ACETAMINOPHEN 10 MG/ML IV SOLN
INTRAVENOUS | Status: DC | PRN
  Administered 2020-10-21: 1000 mg via INTRAVENOUS

## 2020-10-21 MED ORDER — HYDROCODONE-ACETAMINOPHEN 10-325 MG PO TABS: 1.0000 | ORAL_TABLET | Freq: Four times a day (QID) | ORAL | 0 refills | Status: AC | PRN

## 2020-10-21 MED ORDER — HYDROCODONE-ACETAMINOPHEN 10-325 MG PO TABS
1.0000 | ORAL_TABLET | Freq: Four times a day (QID) | ORAL | 0 refills | Status: DC | PRN
  Filled 2020-10-21: qty 28, 7d supply, fill #0

## 2020-10-21 MED ORDER — FENTANYL CITRATE (PF) 100 MCG/2ML IJ SOLN: 25.0000 ug | INTRAMUSCULAR | Status: DC | PRN

## 2020-10-21 MED ORDER — ROCURONIUM BROMIDE 10 MG/ML (PF) SYRINGE
PREFILLED_SYRINGE | INTRAVENOUS | Status: AC
  Filled 2020-10-21: qty 10

## 2020-10-21 MED ORDER — AMOXICILLIN-POT CLAVULANATE 875-125 MG PO TABS
1.0000 | ORAL_TABLET | Freq: Two times a day (BID) | ORAL | 0 refills | Status: DC
  Filled 2020-10-21: qty 20, 10d supply, fill #0

## 2020-10-21 MED ORDER — AMOXICILLIN-POT CLAVULANATE 875-125 MG PO TABS: 1.0000 | ORAL_TABLET | Freq: Two times a day (BID) | ORAL | 0 refills | Status: AC

## 2020-10-21 MED ORDER — ONDANSETRON HCL 4 MG/2ML IJ SOLN
4.0000 mg | Freq: Once | INTRAMUSCULAR | Status: AC | PRN
  Administered 2020-10-21: 4 mg via INTRAVENOUS

## 2020-10-21 MED ORDER — SUGAMMADEX SODIUM 200 MG/2ML IV SOLN
INTRAVENOUS | Status: DC | PRN
  Administered 2020-10-21: 200 mg via INTRAVENOUS

## 2020-10-21 MED ORDER — ONDANSETRON HCL 4 MG/2ML IJ SOLN
INTRAMUSCULAR | Status: AC
  Filled 2020-10-21: qty 2

## 2020-10-21 MED ORDER — 0.9 % SODIUM CHLORIDE (POUR BTL) OPTIME
TOPICAL | Status: DC | PRN
  Administered 2020-10-21: 1000 mL

## 2020-10-21 NOTE — Transfer of Care (Signed)
Immediate Anesthesia Transfer of Care Note  Patient: Rebecca Cherry  Procedure(s) Performed: IRRIGATION AND DEBRIDEMENT BILATERAL UPPER EXTREMITIES, AND LEFT THIGH (Bilateral Hand)  Patient Location: PACU  Anesthesia Type:General  Level of Consciousness: drowsy  Airway & Oxygen Therapy: Patient Spontanous Breathing and Patient connected to nasal cannula oxygen  Post-op Assessment: Report given to RN, Post -op Vital signs reviewed and stable and Patient moving all extremities  Post vital signs: Reviewed and stable  Last Vitals:  Vitals Value Taken Time  BP 87/29 10/21/20 0107  Temp    Pulse 90 10/21/20 0109  Resp 17 10/21/20 0109  SpO2 100 % 10/21/20 0109  Vitals shown include unvalidated device data.  Last Pain:  Vitals:   10/20/20 1900  TempSrc:   PainSc: 3          Complications: No complications documented.

## 2020-10-21 NOTE — Discharge Instructions (Signed)
Animal Bite, Adult Animal bites range from mild to serious. An animal bite can result in any of these injuries:  A scratch.  A deep, open cut.  A puncture of the skin.  A crush injury.  Tearing away of the skin or a body part.  A bone injury. A small bite from a house pet is usually less serious than a bite from a stray or wild animal, such as a raccoon, fox, skunk, or bat. That is because stray and wild animals have a higher risk of carrying a serious infection called rabies, which can be passed to humans through a bite. What increases the risk? You are more likely to be bitten by an animal if:  You are around unfamiliar pets.  You disturb an animal when it is eating, sleeping, or caring for its babies.  You are outdoors in a place where small, wild animals roam freely. What are the signs or symptoms? Common symptoms of an animal bite include:  Pain.  Bleeding.  Swelling.  Bruising. How is this diagnosed? This condition may be diagnosed based on a physical exam and medical history. Your health care provider will examine your wound and ask for details about the animal and how the bite happened. You may also have tests, such as:  Blood tests to check for infection.  X-rays to check for damage to bones or joints.  Taking a fluid sample from your wound and checking it for infection (culture test). How is this treated? Treatment varies depending on the type of animal, where the bite is on your body, and your medical history. Treatment may include:  Caring for the wound. This often includes cleaning the wound, rinsing out (flushing) the wound with saline solution, and applying a bandage (dressing). In some cases, the wound may be closed with stitches (sutures), staples, skin glue, or adhesive strips.  Antibiotic medicine to prevent or treat infection. This medicine may be prescribed in pill or ointment form. If the bite area becomes infected, the medicine may be given  through an IV.  A tetanus shot to prevent tetanus infection.  Rabies treatment to prevent rabies infection. This will be done if the animal could have rabies.  Surgery. This may be done if a bite gets infected or if there is damage that needs to be repaired. Follow these instructions at home: Wound care  Follow instructions from your health care provider about how to take care of your wound. Make sure you: ? Wash your hands with soap and water before you change your dressing. If soap and water are not available, use hand sanitizer. ? Change your dressing as told by your health care provider. ? Leave sutures, skin glue, or adhesive strips in place. These skin closures may need to stay in place for 2 weeks or longer. If adhesive strip edges start to loosen and curl up, you may trim the loose edges. Do not remove adhesive strips completely unless your health care provider tells you to do that.  Check your wound every day for signs of infection. Check for: ? More redness, swelling, or pain. ? More fluid or blood. ? Warmth. ? Pus or a bad smell.   Medicines  Take or apply over-the-counter and prescription medicines only as told by your health care provider.  If you were prescribed an antibiotic, take or apply it as told by your health care provider. Do not stop using the antibiotic even if your condition improves. General instructions  Keep the  injured area raised (elevated) above the level of your heart while you are sitting or lying down, if this is possible.  If directed, put ice on the injured area. ? Put ice in a plastic bag. ? Place a towel between your skin and the bag. ? Leave the ice on for 20 minutes, 2-3 times per day.  Keep all follow-up visits as told by your health care provider. This is important.   Contact a health care provider if:  You have more redness, swelling, or pain around your wound.  Your wound feels warm to the touch.  You have a fever or chills.  You  have a general feeling of sickness (malaise).  You feel nauseous or you vomit.  You have pain that does not get better. Get help right away if:  You have a red streak that leads away from your wound.  You have non-clear fluid or more blood coming from your wound.  There is pus or a bad smell coming from your wound.  You have trouble moving your injured area.  You have numbness or tingling that extends beyond the wound. Summary  Animal bites can range from mild to serious. An animal bite can cause a scratch on the skin, a deep open cut, a puncture of the skin, a crush injury, tearing away of the skin or a body part, or a bone injury.  Your health care provider will examine your wound and ask for details about the animal and how the bite happened.  You may also have tests such as a blood test, X-ray, or testing of a fluid sample from your wound (culture test).  Treatment may include wound care, antibiotic medicine, a tetanus shot, and rabies treatment if the animal could have rabies. This information is not intended to replace advice given to you by your health care provider. Make sure you discuss any questions you have with your health care provider. Document Revised: 04/09/2020 Document Reviewed: 04/09/2020 Elsevier Patient Education  2021 [employer] Inc.   Laceration Care, Adult A laceration is a cut that may go through all layers of the skin and into the tissue that is right under the skin. Some lacerations heal on their own. Others need to be closed with stitches (sutures), staples, skin adhesive strips, or skin glue. Proper care of a laceration reduces the risk for infection, helps the laceration heal better, and may prevent scarring. How to care for your laceration Wash your hands with soap and water before touching your wound or changing your bandage (dressing). If soap and water are not available, use hand sanitizer. Keep the wound clean and dry. If you were given a dressing,  you should change it at least once a day, or as told by your health care provider. You should also change it if it becomes wet or dirty. If sutures or staples were used:  Keep the wound completely dry for the first 24 hours, or as told by your health care provider. After that time, you may shower or bathe. However, make sure that the wound is not soaked in water until after the sutures or staples have been removed.  Clean the wound once each day, or as told by your health care provider: ? Wash the wound with soap and water. ? Rinse the wound with water to remove all soap. ? Pat the wound dry with a clean towel. Do not rub the wound.  After cleaning the wound, apply a thin layer of antibiotic ointment as  told by your health care provider. This will help prevent infection and keep the dressing from sticking to the wound.  Have the sutures or staples removed as told by your health care provider. If skin adhesive strips were used:  Do not get the skin adhesive strips wet. You may shower or bathe, but be careful to keep the wound dry.  If the wound gets wet, pat it dry with a clean towel. Do not rub the wound.  Skin adhesive strips fall off on their own. You may trim the strips as the wound heals. Do not remove skin adhesive strips that are still stuck to the wound. They will fall off in time. If skin glue was used:  Try to keep the wound dry, but you may briefly wet it in the shower or bath. Do not soak the wound in water, such as by swimming.  After you have showered or bathed, gently pat the wound dry with a clean towel. Do not rub the wound.  Do not do any activities that will make you sweat heavily until the skin glue has fallen off on its own.  Do not apply liquid, cream, or ointment medicine to the wound while the skin glue is in place. Using those may loosen the film before the wound has healed.  If a dressing is placed over the wound, be careful not to apply tape directly over the  skin glue. Doing that may cause the glue to be pulled off before the wound has healed.  Do not pick at the glue. Skin glue usually remains in place for 5-10 days and then falls off the skin. General instructions  Take over-the-counter and prescription medicines only as told by your health care provider.  If you were prescribed an antibiotic medicine or ointment, take or apply it as told by your health care provider. Do not stop using it even if your condition improves.  Do not scratch or pick at the wound.  Check your wound every day for signs of infection. Watch for: ? Redness, swelling, or pain. ? Fluid, blood, or pus.  Raise (elevate) the injured area above the level of your heart while you are sitting or lying down for the first 24-48 hours after the laceration is repaired.  If directed, put ice on the affected area: ? Put ice in a plastic bag. ? Place a towel between your skin and the bag. ? Leave the ice on for 20 minutes, 2-3 times a day.  Keep all follow-up visits as told by your health care provider. This is important.   Contact a health care provider if:  You received a tetanus shot and you have swelling, severe pain, redness, or bleeding at the injection site.  You have a fever.  A wound that was closed breaks open.  You notice a bad smell coming from your wound or your dressing.  You notice something coming out of the wound, such as wood or glass.  Your pain is not controlled with medicine.  You have increased redness, swelling, or pain at the site of your wound.  You have fluid, blood, or pus coming from your wound.  You need to change the dressing often due to fluid, blood, or pus that is draining from the wound.  You develop a new rash.  You develop numbness around the wound. Get help right away if:  You develop severe swelling around the wound.  Your pain suddenly increases and is severe.  You develop painful  lumps near the wound or on skin anywhere  else on your body.  You have a red streak going away from your wound.  The wound is on your hand or foot and you cannot properly move a finger or toe.  The wound is on your hand or foot, and you notice that your fingers or toes look pale or bluish. Summary  A laceration is a cut that may go through all layers of the skin and into the tissue that is right under the skin.  Some lacerations heal on their own. Others need to be closed with stitches (sutures), staples, skin adhesive strips, or skin glue.  Proper care of a laceration reduces the risk of infection, helps the laceration heal better, and prevents scarring. This information is not intended to replace advice given to you by your health care provider. Make sure you discuss any questions you have with your health care provider. Document Revised: 08/13/2017 Document Reviewed: 07/05/2017 Elsevier Patient Education  2021 Elsevier Inc. Diet: As you were doing prior to hospitalization   Shower:  May shower but keep the wounds dry, use an occlusive plastic wrap, NO SOAKING IN TUB.  If the bandage gets wet, change with a clean dry gauze.    Dressing:  You may change your dressing 3-5 days after surgery. Your stitches will need to be removed in 2 weeks.   Weight Bearing:  Limit weight bearing with bilateral hands until seen in follow up with your hand surgeon.  To prevent constipation: you may use a stool softener such as -  Colace (over the counter) 100 mg by mouth twice a day  Drink plenty of fluids (prune juice may be helpful) and high fiber foods Miralax (over the counter) for constipation as needed.    Itching:  If you experience itching with your medications, try taking only a single pain pill, or even half a pain pill at a time.  You may take up to 10 pain pills per day, and you can also use benadryl over the counter for itching or also to help with sleep.   Precautions:  If you experience chest pain or shortness of breath -  call 911 immediately for transfer to the hospital emergency department!!  If you develop a fever greater that 101 F, purulent drainage from wound, increased redness or drainage from wound, or calf pain you may call our office at [phone]                                                 Follow- Up Appointment:  Please call for an appointment with your hand surgeon as soon as possible.

## 2020-10-21 NOTE — Anesthesia Postprocedure Evaluation (Signed)
Anesthesia Post Note  Patient: Meron Bocchino  Procedure(s) Performed: IRRIGATION AND DEBRIDEMENT BILATERAL UPPER EXTREMITIES, AND LEFT THIGH (Bilateral Hand)     Patient location during evaluation: PACU Anesthesia Type: General Level of consciousness: awake and alert, awake and oriented Pain management: pain level controlled Vital Signs Assessment: post-procedure vital signs reviewed and stable Respiratory status: spontaneous breathing, nonlabored ventilation, respiratory function stable and patient connected to nasal cannula oxygen Cardiovascular status: blood pressure returned to baseline and stable Postop Assessment: no apparent nausea or vomiting Anesthetic complications: no   No complications documented.  Last Vitals:  Vitals:   10/21/20 0155 10/21/20 0210  BP: (!) 108/44 (!) 110/52  Pulse: 73 69  Resp: 13 15  Temp:    SpO2:  100%    Last Pain:  Vitals:   10/21/20 0210  TempSrc:   PainSc: 0-No pain                 Cecile Hearing

## 2021-03-05 ENCOUNTER — Ambulatory Visit (INDEPENDENT_AMBULATORY_CARE_PROVIDER_SITE_OTHER): Payer: No Typology Code available for payment source | Admitting: Behavioral Health

## 2021-03-05 ENCOUNTER — Other Ambulatory Visit: Payer: Self-pay

## 2021-03-05 VITALS — BP 137/77 | HR 75 | Ht 68.0 in | Wt 176.0 lb

## 2021-03-05 DIAGNOSIS — F411 Generalized anxiety disorder: Secondary | ICD-10-CM

## 2021-03-05 DIAGNOSIS — F900 Attention-deficit hyperactivity disorder, predominantly inattentive type: Secondary | ICD-10-CM

## 2021-03-05 MED ORDER — AMPHETAMINE-DEXTROAMPHETAMINE 20 MG PO TABS: 20.0000 mg | ORAL_TABLET | Freq: Every day | ORAL | 0 refills | Status: DC

## 2021-03-05 MED ORDER — AMPHETAMINE-DEXTROAMPHET ER 20 MG PO CP24: 20.0000 mg | ORAL_CAPSULE | Freq: Every day | ORAL | 0 refills | Status: DC

## 2021-03-06 ENCOUNTER — Encounter: Payer: Self-pay | Admitting: Behavioral Health

## 2021-03-06 NOTE — Progress Notes (Signed)
[facility] MD/PA/NP Initial Note  03/06/2021 12:48 AM Rebecca Cherry  MRN:  [id number]  Chief Complaint:  Chief Complaint   ADHD; Anxiety; Establish Care; Medication Refill; Medication Problem     HPI:  45 year old female presents to this office for initial visit and to establish care. She says that she has struggle with anxiety and symptoms she associated with attention and decreased concentration problems. She said that after retirement from the [employer] she endeavored in many private business projects that required much attention and dedication to be successful. She found her self beginning to get paralyzed with lack of motivation and was unable to complete projects. She originally started seeking care at the [place] who then contracted out to [facility] for care. Says that she went on Vyvanse in 2019 and it helped for a while but  eventually stopped working well at 60 mg daily. She stopped and started the medication several time but felt that her care was not being properly managed. She has attempted therapy but is also interested in trying another medication to help. She is unsure if the ADHD is the only diagnosis because she continues to have some anxiety and questions depression at times. She understands that diagnosis is work in progress. She denies mania, no psychosis. No SI/HI.  No prior medication trials;  Visit Diagnosis:    ICD-10-CM   1. ADHD (attention deficit hyperactivity disorder), inattentive type  F90.0 amphetamine-dextroamphetamine (ADDERALL XR) 20 MG 24 hr capsule    amphetamine-dextroamphetamine (ADDERALL) 20 MG tablet    2. Generalized anxiety disorder  F41.1 amphetamine-dextroamphetamine (ADDERALL XR) 20 MG 24 hr capsule    amphetamine-dextroamphetamine (ADDERALL) 20 MG tablet      Past Psychiatric History: ADHD, Anxiety, [facility]  Past Medical History:  Past Medical History:  Diagnosis Date   Prolonged QT interval syndrome     Past Surgical History:   Procedure Laterality Date   I & D EXTREMITY Bilateral 10/20/2020   Procedure: IRRIGATION AND DEBRIDEMENT BILATERAL UPPER EXTREMITIES, AND LEFT THIGH;  Surgeon: Teryl Lucy, MD;  Location: [facility] OR;  Service: Orthopedics;  Laterality: Bilateral;    Family Psychiatric History: see chart  Family History: History reviewed. No pertinent family history.  Social History:  Social History   Socioeconomic History   Marital status: Married    Spouse name: Tammy Sours   Number of children: 0   Years of education: 16   Highest education level: Bachelor's degree (e.g., BA, AB, BS)  Occupational History   Occupation: Retired Information systems manager   Occupation: Owns and Operates Geneticist, molecular  Tobacco Use   Smoking status: Never   Smokeless tobacco: Never  Substance and Sexual Activity   Alcohol use: Not on file   Drug use: Never   Sexual activity: Yes  Other Topics Concern   Not on file  Social History Narrative   Lives in [place] with husband Tammy Sours. 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 She suffered severe dog attack from retired Hotel manager working dog this past year requiring surgical intervention to both hands and arm bilat.     Social Determinants of Health   Financial Resource Strain: Not on file  Food Insecurity: Not on file  Transportation Needs: Not on file  Physical Activity: Not on file  Stress: Not on file  Social Connections: Not on file    Allergies: No Known Allergies  Metabolic Disorder Labs: No results found for: HGBA1C, MPG No results found for: PROLACTIN No  results found for: CHOL, TRIG, HDL, CHOLHDL, VLDL, LDLCALC No results found for: TSH  Therapeutic Level Labs: No results found for: LITHIUM No results found for: VALPROATE No components found for:  CBMZ  Current Medications: Current Outpatient Medications  Medication Sig Dispense Refill   amphetamine-dextroamphetamine (ADDERALL XR) 20 MG 24 hr capsule Take 1 capsule (20  mg total) by mouth daily. 30 capsule 0   amphetamine-dextroamphetamine (ADDERALL) 20 MG tablet Take 1 tablet (20 mg total) by mouth daily. 30 tablet 0   Cholecalciferol (VITAMIN D) 50 MCG (2000 UT) tablet Take by mouth.     docusate sodium (COLACE) 100 MG capsule Take by mouth.     fluticasone (FLONASE) 50 MCG/ACT nasal spray Place into both nostrils.     gabapentin (NEURONTIN) 100 MG capsule Take by mouth.     HYDROcodone-acetaminophen (NORCO) 10-325 MG tablet Take 1 tablet by mouth every 6 (six) hours as needed. (Patient not taking: Reported on 03/05/2021) 28 tablet 0   No current facility-administered medications for this visit.    Medication Side Effects: none  Orders placed this visit:  No orders of the defined types were placed in this encounter.   Psychiatric Specialty Exam:  Review of Systems  Constitutional: Negative.   Gastrointestinal:  Positive for diarrhea.  Musculoskeletal:  Positive for back pain and neck pain.  Allergic/Immunologic: Negative.   Neurological: Negative.   Psychiatric/Behavioral:  Positive for decreased concentration. The patient is nervous/anxious.    Blood pressure 137/77, pulse 75, height 5\' 8"  (1.727 m), weight 176 lb (79.8 kg).Body mass index is 26.76 kg/m.  General Appearance: Casual, Neat, and Well Groomed  Eye Contact:  Good  Speech:  Clear and Coherent  Volume:  Normal  Mood:  NA  Affect:  Appropriate  Thought Process:  Coherent  Orientation:  Full (Time, Place, and Person)  Thought Content: Logical   Suicidal Thoughts:  No  Homicidal Thoughts:  No  Memory:  WNL  Judgement:  Good  Insight:  Good  Psychomotor Activity:  Normal  Concentration:  Concentration: Good  Recall:  Good  Fund of Knowledge: Good  Language: Good  Assets:  Desire for Improvement  ADL's:  Intact  Cognition: WNL  Prognosis:  Good   Screenings:  GAD-7    Flowsheet Row Office Visit from 03/05/2021 in [facility]  Total GAD-7 Score 15       PHQ2-9    Flowsheet Row Office Visit from 03/05/2021 in [facility]  PHQ-2 Total Score 1      Flowsheet Row ED to Hosp-Admission (Discharged) from 10/20/2020 in [place] [facility]  C-SSRS RISK CATEGORY No Risk       Receiving Psychotherapy: No   Treatment Plan/Recommendations:  Greater than 50% of face to face time with patient was spent on counseling and coordination of care. We discussed her long history of ADHD and various medication trials. No changes were necessary at this time.  Continue  Adderall 20 mg XR in the am Continue Adderall 20 mg IR in the afternoon for boost Will report any side effects or worsening symptoms To follow up in 3 weeks to reassess. Provided emergency contact information Discussed potential benefits, risks, and side effects of stimulants with patient to include increased heart rate, palpitations, insomnia, increased anxiety, increased irritability, or decreased appetite.  Instructed patient to contact office if experiencing any significant tolerability issues.     10/22/2020, NP

## 2021-04-01 ENCOUNTER — Encounter: Payer: Self-pay | Admitting: Behavioral Health

## 2021-04-01 ENCOUNTER — Other Ambulatory Visit: Payer: Self-pay

## 2021-04-01 ENCOUNTER — Telehealth: Payer: Self-pay | Admitting: Behavioral Health

## 2021-04-01 ENCOUNTER — Ambulatory Visit (INDEPENDENT_AMBULATORY_CARE_PROVIDER_SITE_OTHER): Payer: No Typology Code available for payment source | Admitting: Behavioral Health

## 2021-04-01 ENCOUNTER — Ambulatory Visit (INDEPENDENT_AMBULATORY_CARE_PROVIDER_SITE_OTHER): Payer: No Typology Code available for payment source | Admitting: Psychiatry

## 2021-04-01 DIAGNOSIS — F411 Generalized anxiety disorder: Secondary | ICD-10-CM

## 2021-04-01 DIAGNOSIS — F900 Attention-deficit hyperactivity disorder, predominantly inattentive type: Secondary | ICD-10-CM

## 2021-04-01 MED ORDER — AMPHETAMINE-DEXTROAMPHETAMINE 20 MG PO TABS: 20.0000 mg | ORAL_TABLET | Freq: Every day | ORAL | 0 refills | Status: AC

## 2021-04-01 MED ORDER — AMPHETAMINE-DEXTROAMPHETAMINE 20 MG PO TABS: ORAL_TABLET | ORAL | 0 refills | Status: DC

## 2021-04-01 MED ORDER — AMPHETAMINE-DEXTROAMPHET ER 20 MG PO CP24: 20.0000 mg | ORAL_CAPSULE | Freq: Every day | ORAL | 0 refills | Status: AC

## 2021-04-01 MED ORDER — AMPHETAMINE-DEXTROAMPHET ER 20 MG PO CP24: ORAL_CAPSULE | ORAL | 0 refills | Status: AC

## 2021-04-01 MED ORDER — AMPHETAMINE-DEXTROAMPHET ER 20 MG PO CP24: ORAL_CAPSULE | ORAL | 0 refills | Status: DC

## 2021-04-01 NOTE — Telephone Encounter (Signed)
Please review

## 2021-04-01 NOTE — Telephone Encounter (Signed)
Pharmacy called and said that they can't fill the adderall as a 90 tabs as a controlled. It needs to be 30 day supply. You can send two scripts with different fill dates. There system can only hold 2 scripts at a time. Please resend adderall scripts

## 2021-04-01 NOTE — Progress Notes (Signed)
[facility]  Patient ID: Rebecca Cherry,  MRN: [id number]  PCP: Elisabeth Cara, MD  Date of Evaluation: 04/01/2021 Time spent:30 minutes  Chief Complaint:  Chief Complaint   ADHD; Anxiety; Follow-up; Medication Refill     HISTORY/CURRENT STATUS: HPI  45 year old female presents to this office for follow up and medication refills. She believes that her anxiety is closely related to her symptoms associated with ADHD. She says that her symptoms of ADHD continued to be managed well. She is not wanting to adjust her medications at this time. 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  She agrees to follow up in 3 months after the new year. She denies mania, no psychosis. No SI/HI.  Individual Medical History/ Review of Systems: Changes? :No   Allergies: Patient has no known allergies.  Current Medications:  Current Outpatient Medications:    [START ON 05/01/2021] amphetamine-dextroamphetamine (ADDERALL XR) 20 MG 24 hr capsule, Take 1 capsule (20 mg total) by mouth daily., Disp: 30 capsule, Rfl: 0   amphetamine-dextroamphetamine (ADDERALL) 20 MG tablet, Take 1 tablet (20 mg total) by mouth daily., Disp: 30 tablet, Rfl: 0   [START ON 05/01/2021] amphetamine-dextroamphetamine (ADDERALL) 20 MG tablet, Take 1 tablet (20 mg total) by mouth daily., Disp: 30 tablet, Rfl: 0   amphetamine-dextroamphetamine (ADDERALL XR) 20 MG 24 hr capsule, Take one tablet in the morning after breakfast., Disp: 30 capsule, Rfl: 0   Cholecalciferol (VITAMIN D) 50 MCG (2000 UT) tablet, Take by mouth., Disp: , Rfl:    docusate sodium (COLACE) 100 MG capsule, Take by mouth., Disp: , Rfl:    fluticasone (FLONASE) 50 MCG/ACT nasal spray, Place into both nostrils., Disp: , Rfl:    gabapentin (NEURONTIN) 100 MG capsule, Take by mouth., Disp: , Rfl:    HYDROcodone-acetaminophen (NORCO) 10-325 MG tablet, Take 1 tablet by mouth every 6 (six) hours as  needed. (Patient not taking: Reported on 03/05/2021), Disp: 28 tablet, Rfl: 0 Medication Side Effects: none  Family Medical/ Social History: Changes? No  MENTAL HEALTH EXAM:  There were no vitals taken for this visit.There is no height or weight on file to calculate BMI.  General Appearance: Casual, Neat, and Well Groomed  Eye Contact:  Good  Speech:  Clear and Coherent  Volume:  Normal  Mood:  NA  Affect:  Appropriate  Thought Process:  Coherent  Orientation:  Full (Time, Place, and Person)  Thought Content: Logical   Suicidal Thoughts:  No  Homicidal Thoughts:  No  Memory:  WNL  Judgement:  Good  Insight:  Good  Psychomotor Activity:  Normal  Concentration:  Concentration: Good  Recall:  Good  Fund of Knowledge: Good  Language: Good  Assets:  Desire for Improvement  ADL's:  Intact  Cognition: WNL  Prognosis:  Good    DIAGNOSES:    ICD-10-CM   1. ADHD (attention deficit hyperactivity disorder), inattentive type  F90.0 amphetamine-dextroamphetamine (ADDERALL XR) 20 MG 24 hr capsule    amphetamine-dextroamphetamine (ADDERALL) 20 MG tablet    amphetamine-dextroamphetamine (ADDERALL) 20 MG tablet    amphetamine-dextroamphetamine (ADDERALL XR) 20 MG 24 hr capsule    DISCONTINUED: amphetamine-dextroamphetamine (ADDERALL XR) 20 MG 24 hr capsule    DISCONTINUED: amphetamine-dextroamphetamine (ADDERALL) 20 MG tablet    2. Generalized anxiety disorder  F41.1 amphetamine-dextroamphetamine (ADDERALL XR) 20 MG 24 hr capsule    amphetamine-dextroamphetamine (ADDERALL) 20 MG tablet    amphetamine-dextroamphetamine (ADDERALL) 20 MG tablet  amphetamine-dextroamphetamine (ADDERALL XR) 20 MG 24 hr capsule    DISCONTINUED: amphetamine-dextroamphetamine (ADDERALL XR) 20 MG 24 hr capsule    DISCONTINUED: amphetamine-dextroamphetamine (ADDERALL) 20 MG tablet      Receiving Psychotherapy: Yes Rockne Menghini   RECOMMENDATIONS:  Greater than 50% of  30 min face to face time with patient  was spent on counseling and coordination of care. We discussed her currently stability with ADHD. Pt agreed No changes were necessary at this time. Refills to be provided. Continue  Adderall 20 mg XR in the am Continue Adderall 20 mg IR in the afternoon for boost Will report any side effects or worsening symptoms To follow up in 3 months to reassess. Provided emergency contact information Discussed potential benefits, risks, and side effects of stimulants with patient to include increased heart rate, palpitations, insomnia, increased anxiety, increased irritability, or decreased appetite.  Instructed patient to contact office if experiencing any significant tolerability issues.    Joan Flores, NP

## 2021-04-01 NOTE — Telephone Encounter (Signed)
Done. Resent scripts as 30 day supply for next two months.

## 2021-04-01 NOTE — Progress Notes (Signed)
[facility] Counsel[facility] Initial Adult Exam  Name: Rebecca Cherry Date: 04/01/2021 MRN: [id number] DOB: 1976/02/02 PCP: Loma Sender, MD  Time spent: 60 minutes  Guardian/Payee:  patient     Paperwork requested:  No   Reason for Visit /Presenting Problem: anxiety, severe procrastination which feeds my anxiety, depression, anger/frustration when I can't get things done, "last minute super achiever" which adds to my anxiety  Mental Status Exam:    Appearance:   Casual     Behavior:  Appropriate, Sharing, and Motivated  Motor:  Normal  Speech/Language:   Clear and Coherent and Normal Rate  Affect:  Anxious,  some depression  Mood:  anxious and some depression  Thought process:  goal directed  Thought content:    Overthinking, obsessiveness  Sensory/Perceptual disturbances:    WNL  Orientation:  oriented to person, place, time/date, situation, day of week, month of year, year, and stated date of Oct. 4, 2022  Attention:  Good  Concentration:  Fair  Memory:  Some short term memory issues  Fund of knowledge:   Good  Insight:    Good  Judgment:   Good  Impulse Control:  Good and Fair   Reported Symptoms:  see symptoms above  Risk Assessment: Danger to Self:  No Self-injurious Behavior: No Danger to Others: No Duty to Warn:no Physical Aggression / Violence:No  Access to Firearms a concern: No  Gang Involvement:No  Patient / guardian was educated about steps to take if suicide or homicide risk level increases between visits: Denies any SI and knows how to access emergency help if ever needed at hospital. While future psychiatric events cannot be accurately predicted, the patient does not currently require acute inpatient psychiatric care and does not currently meet [facility] involuntary commitment criteria.  Substance Abuse History: Current substance abuse: No     Past Psychiatric History:   No previous psychological problems have been observed Outpatient  Providers:none History of Psych Hospitalization: No  Psychological Testing:  n/a    Abuse History: Victim of No.,  n/a    Report needed: No. Victim of Neglect:No. Perpetrator of  n/a   Witness / Exposure to Domestic Violence: No   Protective Services Involvement: No  Witness to [employer] Violence:  No   Family History: No family history on file. Mom still living and patient is somewhat close to her, but mom lives in [place] and visits patient in the summertime. Dad is deceased. Has 1 older sister and patient are close even thougth sister lives in [place]. Entered  [employer] right after high school at age 51 and graduated from [employer] and stayed with [employer] 24 years before retiring.  Been retired 4 yrs.  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  Sexual Orientation:  Straight  Relationship Status: married 6 yrs to gentleman she met in [employer].  Is close to husband's family.   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, commercial and Corporate treasurer)  Armed forces logistics/support/administrative officer: Yes   Educational History: Education:  Conservator, museum/gallery through [employer]  Religion/Sprituality/World View:   Protestant; active in her church  Any cultural  differences that may affect / interfere with treatment:  not applicable   Recreation/Hobbies: working on their farm, "Counselling psychologist", woodworking  Stressors: Other: financial stressors, and recovering from being mauled by a large dog and suffered hand injuries    Strengths:  Supportive Relationships, Family, Friends, Social worker, Spirituality, Hopefulness, Conservator, museum/gallery, and Able to Communicate Effectively, patient states her biggest strengths are: highly determined and  adaptable  Barriers:  myself and procrastination, my husband   Legal History: Pending legal issue / charges:  none. History of legal issue / charges:  n/a  Medical History/Surgical History:Reviewed with patient and info below confirmed Past Medical History:  Diagnosis Date   Prolonged QT interval syndrome     Past Surgical History:  Procedure Laterality Date   I & D EXTREMITY Bilateral 10/20/2020   Procedure: IRRIGATION AND DEBRIDEMENT BILATERAL UPPER EXTREMITIES, AND LEFT THIGH;  Surgeon: Marchia Bond, MD;  Location: [facility];  Service: Orthopedics;  Laterality: Bilateral;    Medications: Current Outpatient Medications  Medication Sig Dispense Refill   amphetamine-dextroamphetamine (ADDERALL XR) 20 MG 24 hr capsule Take 1 capsule (20 mg total) by mouth daily. 30 capsule 0   amphetamine-dextroamphetamine (ADDERALL) 20 MG tablet Take 1 tablet (20 mg total) by mouth daily. 30 tablet 0   Cholecalciferol (VITAMIN D) 50 MCG (2000 UT) tablet Take by mouth.     docusate sodium (COLACE) 100 MG capsule Take by mouth.     fluticasone (FLONASE) 50 MCG/ACT nasal spray Place into both nostrils.     gabapentin (NEURONTIN) 100 MG capsule Take by mouth.     HYDROcodone-acetaminophen (NORCO) 10-325 MG tablet Take 1 tablet by mouth every 6 (six) hours as needed. (Patient not taking: Reported on 03/05/2021) 28 tablet 0   No current facility-administered medications for this visit.    No Known Allergies  Dust, mold "I think" as I get sick if exposed to them, but never been tested  Diagnoses:    ICD-10-CM   1. Generalized anxiety disorder  F41.1       Treatment goal plan: Patient not signing treatment goal plan on computer screen due to COVID. Treatment goals: Treatment goals remain on treatment plan as patient works with strategies to achieve her goals.  Progress is assessed at each session and documented in the "progress" section of treatment note. Long-term goal: Reduce overall level,  intensity, and frequency of the anxiety so that daily functioning is not impaired. Short-term goal: Verbalize an understanding of the role that fearful thinking plays in creating fears, excessive worry, and persistent anxiety symptoms. Increase understanding of beliefs and messages that produce worry and anxiety Strategies: Develop behavioral and cognitive strategies to reduce or eliminate irrational anxiety.    Plan of Care:  This is patient's first visit with this therapist and collaboratively we completed her initial evaluation and initial treatment plan.  Patient is a 45 year old female who does have prior experience in therapy.  She presents today with symptoms of anxiety, severe procrastination which feeds my anxiety, depression, anger/frustration when I cannot get things done, and added that she is a "last-minute super achiever" which also adds to her anxiety.  Shares that she is also being treated with medication for ADHD.  Patient has been married for 6 years to a gentleman she met while both of them are in the [employer] and she describes it as being a good marriage.  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  Patient reports having no children.  Her mother is still living and patient reports being somewhat close to her but mom does live in [place] and visits patient in the summertime.  Patient's father is deceased.  She has 1 older sister with which she describes being close even though that sister lives in [place].  Patient entered the [employer] right after having graduated from high school at age 36, then went to [employer] and ended up staying with the [employer] 24 years before retiring 4 years ago.  Seems to be very proud of her time in the [employer].  Her husband was actually in the [employer].  Patient presents today dressed casual, behavior is  appropriate and motivated, motor skills normal, mood and affect are anxious with some depression, thought process is goal directed, thought content includes some overthinking and obsessiveness, oriented to person/place/time/date/situation/day of week/month of year/year and gives correct stated date of April 01, 2021.  Her attention is good, concentration fair, memory includes some short-term memory issues that she reports, insight and judgment good, and impulse control she describes as good and fair depending on certain situations.  Denies any thoughts to harm self or others.  She describes her strengths as being supportive relationships, family, friends, church, spirituality, a sense of hopefulness, self advocate, and able to communicate effectively with others.  She feels that her biggest strengths are "being highly determined and adaptable".  She denies any pending legal history.  Does experience some financial stressors and receives a pension from [employer] as does her husband also.  Another stressor for patient has been recovering after being mauled recently by a large dog and suffered hand injuries which she feels she is healing appropriately.  Patient seems quite concerned about her ADHD and describes using "brown noise" when needing to focus more intentionally on things.  She is on medication currently that she reports most recently seems to be helping her ADHD symptomology.  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  She seems motivated, intelligent, and ready to work on her goals.  Other information as well as treatment goal plan can be found above as part of this initial evaluation on patient.  Review of treatment plan with patient and she is in agreement.  Next appointment within 2 to 3 weeks based on her schedule.  This record has been created using [employer].  Chart creation errors have been sought, but  may not always have been located and corrected.  Such creation errors do not reflect on the standard of medical care provided.    Shanon Ace, LCSW

## 2021-04-28 ENCOUNTER — Ambulatory Visit (INDEPENDENT_AMBULATORY_CARE_PROVIDER_SITE_OTHER): Payer: No Typology Code available for payment source | Admitting: Psychiatry

## 2021-04-28 ENCOUNTER — Other Ambulatory Visit: Payer: Self-pay

## 2021-04-28 DIAGNOSIS — F411 Generalized anxiety disorder: Secondary | ICD-10-CM

## 2021-04-28 NOTE — Progress Notes (Signed)
[facility] Counselor/Therapist Progress Note  Patient ID: Parissa Chiao, MRN: [id number],    Date: 04/28/2021  Time Spent: 55 minutes   Treatment Type: Individual Therapy  Reported Symptoms: anxiety, some depression  Mental Status Exam:  Appearance:   Well Groomed     Behavior:  Appropriate, Sharing, and Motivated  Motor:  Normal  Speech/Language:   Clear and Coherent  Affect:  Depressed and anxiety  Mood:  anxious and depressed  Thought process:  goal directed  Thought content:    WNL  Sensory/Perceptual disturbances:    WNL  Orientation:  oriented to person, place, time/date, situation, day of week, month of year, year, and stated date of Oct. 31, 2022  Attention:  Fair  Concentration:  Fair  Memory:  WNL; later stated she is forgetting a lot but feels it's related to her ADHD  Fund of knowledge:   Good  Insight:    Good  Judgment:   Good  Impulse Control:  Good   Risk Assessment: Danger to Self:  No Self-injurious Behavior: No Danger to Others: No Duty to Warn:no Physical Aggression / Violence:No  Access to Firearms a concern: No  Gang Involvement:No   Subjective:  Patient in today reporting anxiety as main symptom. Some depression related to health issues bouncing back from injury  sustained by attack by large dog.  Concerned about her feeling disorganized, anxiety building, forgetting that she attributes to her adhd (forgetting, procrastinating, not answering calls nor emails.  "I need to be better in following areas to feel less anxious and improve":  Lack of boundaries and limits. Feel more productive at end of the day. I get interrupted and don't complete tasks. Stop using lists so much as I end up with 50 lists and don't get anything done. Increased task completion. Improve memory, but states some times are worse than others. Stay on topic in talking and not "hopping around." Lack of boundaries. Leaves office door open.  Discussed these  with patient and she is going to focus primarily on the first 2 listed above between now and next session.  Discussed scenarios as to how she needs to be different and having more boundaries and limits and ways to do that most effectively, including how to manage the many times she gets interrupted in a day without being rude to people.  Also discussed expectations of herself to make the more realistic.  She does feel that she may improve more as she remains on her current medication to help with her ADHD.  Discussed her being less judgmental and looking for positives more than negatives.   Interventions: Solution-Oriented/Positive Psychology, Ego-Supportive, and Insight-Oriented  Diagnosis:   ICD-10-CM   1. Generalized anxiety disorder  F41.1      Treatment goal plan: Patient not signing treatment goal plan on computer screen due to COVID. Treatment goals: Treatment goals remain on treatment plan as patient works with strategies to achieve her goals.  Progress is assessed at each session and documented in the "progress" section of treatment note. Long-term goal: Reduce overall level, intensity, and frequency of the anxiety so that daily functioning is not impaired. Short-term goal: Verbalize an understanding of the role that fearful thinking plays in creating fears, excessive worry, and persistent anxiety symptoms. Increase understanding of beliefs and messages that produce worry and anxiety Strategies: Develop behavioral and cognitive strategies to reduce or eliminate irrational anxiety.   Plan: Patient today showing good motivation and participation in session.  Has  remained on her medications since last appointment and reports "maybe some improvement since starting the medicine" but has not been able to do anything herself in terms of strategies to help her anxiety and being able to stay on task.  We worked on this today and she responded favorably.  She was able to list the primary  concerns she had which were mostly work-related, as noted above.  We narrowed down that focus to a couple of the main areas she wants to work on as far as changing which include having boundaries and limits with others and also finishing a task including setting limits with others so she can finish those tasks.  Still procrastinating and we processed how that can affect her trying to change behaviors and be more productive, and how she might structure her day in ways that might not lead to as much procrastination.  Wants to be able to feel better about "getting things done" and not always being the 1 that gets things done" at the last minute as that feeds her anxiety.  Mood and affect were a little bit less anxious today and depression seemed somewhat less also, which she attributes to actually talking through some of her issues and trying to work on strategies to improve.  Overthinking and obsessiveness continues.  She does feel that she has a lot of determination and can be adaptable which she attributes is being what might help her work on these new behaviors discussed today.  Goal review and progress/challenges noted with patient.  Next appt within 2 weeks.  This record has been created using [employer].  Chart creation errors have been sought, but may not always have been located and corrected.  Such creation errors do not reflect on the standard of medical care provided.    Mathis Fare, LCSW

## 2021-05-05 ENCOUNTER — Ambulatory Visit (INDEPENDENT_AMBULATORY_CARE_PROVIDER_SITE_OTHER): Payer: No Typology Code available for payment source | Admitting: Psychiatry

## 2021-05-05 ENCOUNTER — Other Ambulatory Visit: Payer: Self-pay

## 2021-05-05 DIAGNOSIS — F900 Attention-deficit hyperactivity disorder, predominantly inattentive type: Secondary | ICD-10-CM | POA: Diagnosis not present
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
## 2021-05-19 ENCOUNTER — Ambulatory Visit: Payer: No Typology Code available for payment source | Admitting: Psychiatry

## 2021-06-02 ENCOUNTER — Ambulatory Visit: Payer: No Typology Code available for payment source | Admitting: Psychiatry

## 2021-06-17 ENCOUNTER — Ambulatory Visit: Payer: No Typology Code available for payment source | Admitting: Psychiatry
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
## 2023-11-30 ENCOUNTER — Telehealth: Payer: Self-pay | Admitting: Oncology

## 2023-11-30 NOTE — Telephone Encounter (Signed)
 11/30/23 Patient rescheduled appt.She stated other commitment and may be going to [facility]

## 2023-12-07 ENCOUNTER — Ambulatory Visit: Admitting: Oncology

## 2023-12-14 ENCOUNTER — Inpatient Hospital Stay: Admitting: Oncology
